# Patient Record
Sex: Male | Born: 1980 | Race: White | Hispanic: No | Marital: Married | Smoking: Former smoker
Health system: Southern US, Community
[De-identification: ages and names within clinical notes are randomized; demographics above are authoritative.]

## PROBLEM LIST (undated history)

## (undated) DIAGNOSIS — G4733 Obstructive sleep apnea (adult) (pediatric): Secondary | ICD-10-CM

## (undated) DIAGNOSIS — G6181 Chronic inflammatory demyelinating polyneuritis: Secondary | ICD-10-CM

## (undated) DIAGNOSIS — Z85820 Personal history of malignant melanoma of skin: Secondary | ICD-10-CM

## (undated) DIAGNOSIS — R739 Hyperglycemia, unspecified: Secondary | ICD-10-CM

## (undated) DIAGNOSIS — E781 Pure hyperglyceridemia: Secondary | ICD-10-CM

## (undated) HISTORY — DX: Pure hyperglyceridemia: E78.1

## (undated) HISTORY — DX: Hyperglycemia, unspecified: R73.9

## (undated) HISTORY — DX: Personal history of malignant melanoma of skin: Z85.820

## (undated) HISTORY — DX: Obstructive sleep apnea (adult) (pediatric): G47.33

## (undated) HISTORY — DX: Chronic inflammatory demyelinating polyneuritis: G61.81

---

## 2000-03-28 ENCOUNTER — Emergency Department (HOSPITAL_COMMUNITY): Admission: EM | Admit: 2000-03-28 | Discharge: 2000-03-28 | Payer: Self-pay | Admitting: Emergency Medicine

## 2003-07-23 ENCOUNTER — Emergency Department (HOSPITAL_COMMUNITY): Admission: EM | Admit: 2003-07-23 | Discharge: 2003-07-23 | Payer: Self-pay | Admitting: Emergency Medicine

## 2008-06-13 ENCOUNTER — Emergency Department (HOSPITAL_BASED_OUTPATIENT_CLINIC_OR_DEPARTMENT_OTHER): Admission: EM | Admit: 2008-06-13 | Discharge: 2008-06-13 | Payer: Self-pay | Admitting: Emergency Medicine

## 2008-06-23 ENCOUNTER — Emergency Department (HOSPITAL_BASED_OUTPATIENT_CLINIC_OR_DEPARTMENT_OTHER): Admission: EM | Admit: 2008-06-23 | Discharge: 2008-06-23 | Payer: Self-pay | Admitting: Emergency Medicine

## 2009-03-29 ENCOUNTER — Emergency Department (HOSPITAL_BASED_OUTPATIENT_CLINIC_OR_DEPARTMENT_OTHER): Admission: EM | Admit: 2009-03-29 | Discharge: 2009-03-30 | Payer: Self-pay | Admitting: Emergency Medicine

## 2009-03-30 ENCOUNTER — Ambulatory Visit: Payer: Self-pay | Admitting: Radiology

## 2015-12-21 HISTORY — PX: OLECRANON BURSECTOMY: SHX2097

## 2016-04-22 ENCOUNTER — Encounter: Payer: Self-pay | Admitting: Family Medicine

## 2016-04-22 ENCOUNTER — Ambulatory Visit (INDEPENDENT_AMBULATORY_CARE_PROVIDER_SITE_OTHER): Payer: 59 | Admitting: Family Medicine

## 2016-04-22 ENCOUNTER — Ambulatory Visit: Payer: Self-pay | Admitting: Family Medicine

## 2016-04-22 VITALS — BP 139/89 | HR 96 | Ht 72.0 in | Wt 282.0 lb

## 2016-04-22 DIAGNOSIS — E669 Obesity, unspecified: Secondary | ICD-10-CM | POA: Diagnosis not present

## 2016-04-22 DIAGNOSIS — Z0000 Encounter for general adult medical examination without abnormal findings: Secondary | ICD-10-CM | POA: Diagnosis not present

## 2016-04-22 DIAGNOSIS — Z85820 Personal history of malignant melanoma of skin: Secondary | ICD-10-CM | POA: Diagnosis not present

## 2016-04-22 DIAGNOSIS — IMO0001 Reserved for inherently not codable concepts without codable children: Secondary | ICD-10-CM | POA: Insufficient documentation

## 2016-04-22 HISTORY — DX: Personal history of malignant melanoma of skin: Z85.820

## 2016-04-22 NOTE — Assessment & Plan Note (Signed)
Doing reasonably well. Obesity is the largest issue here. Refer to medical nutrition therapy. Check fasting labs. Recheck in a few months. Work on smoking cessation.

## 2016-04-22 NOTE — Progress Notes (Signed)
       Terrance Carlson is a 35 y.o. male who [facility] today for status care and wellness visit. Patient presents to clinic today to establish care for wellness visit. He notes persistent obesity as a main medical problem. Additionally he has a history of bilateral knee pain that is currently doing pretty well. 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  Obesity: Continues to be an issue. He's tried some lifestyle modification but finds it challenging. He is very motivated to change his diet.   History reviewed. No pertinent past medical history. History reviewed. No pertinent past surgical history. Social History  Substance Use Topics  . Smoking status: Current Every Day Smoker  . Smokeless tobacco: Not on file  . Alcohol Use: Not on file   family history is not on file.  ROS as above Medications: No current outpatient prescriptions on file.   No current facility-administered medications for this visit.   No Known Allergies   Exam:  BP 139/89 mmHg  Pulse 96  Ht 6' (1.829 m)  Wt 282 lb (127.914 kg)  BMI 38.24 kg/m2 Gen: Well NAD Obese HEENT: EOMI,  MMM Lungs: Normal work of breathing. CTABL Heart: RRR no MRG Abd: NABS, Soft. Nondistended, Nontender Exts: Brisk capillary refill, warm and well perfused.  Skin: 2 small nevus right abdomen without significant irregular borders or multiple pigmentations. Well-appearing scar left abdomen  STOP BANG: Snore:    Yes Tired:     No Observed stop breathing:  No Hypertension:   No  BMI >35:   Yes Age >50:   No Neck > 16 inches:  Yes Male gender:   Yes ------------------------------------------ Total:     4/8   No results found for this or any previous visit (from the past 24 hour(s)). No results found.   Please see individual  assessment and plan sections.

## 2016-04-22 NOTE — Patient Instructions (Signed)
Thank you for coming in today. Get fasting labs soon.  Work on a lower car diet.  Eat black beans, green veggies, backed chicken or fish etc.  Follow up with a Dietitian.  Return in a few months.  Restart diet pills.   Phentermine sustained-release capsules What is this medicine? PHENTERMINE (FEN ter meen) decreases your appetite. It is used with a reduced calorie diet and exercise to help you lose weight. This medicine may be used for other purposes; ask your health care provider or pharmacist if you have questions. What should I tell my health care provider before I take this medicine? They need to know if you have any of these conditions: -agitation -glaucoma -heart disease -high blood pressure -history of substance abuse -lung disease called Primary Pulmonary Hypertension (PPH) -taken an MAOI like Carbex, Eldepryl, Marplan, Nardil, or Parnate in last 14 days -thyroid disease -an unusual or allergic reaction to phentermine, other medicines, foods, dyes, or preservatives -pregnant or trying to get pregnant -breast-feeding How should I use this medicine? Take this medicine by mouth with a glass of water. Follow the directions on the prescription label. This medicine is usually taken before breakfast or at least 10 to 14 hours before going to bed. Avoid taking this medicine in the evening. It may interfere with sleep. Swallow whole. Do not open or chew the capsules. Take your doses at regular intervals. Do not take your medicine more often than directed. Talk to your pediatrician regarding the use of this medicine in children. Special care may be needed. Overdosage: If you think you have taken too much of this medicine contact a poison control center or emergency room at once. NOTE: This medicine is only for you. Do not share this medicine with others. What if I miss a dose? If you miss a dose, take it as soon as you can. If it is almost time for your next dose, take only that dose. Do  not take double or extra doses. What may interact with this medicine? Do not take this medicine with any of the following medications: -duloxetine -MAOIs like Carbex, Eldepryl, Marplan, Nardil, and Parnate -medicines for colds or breathing difficulties like pseudoephedrine or phenylephrine -procarbazine -sibutramine -SSRIs like citalopram, escitalopram, fluoxetine, fluvoxamine, paroxetine, and sertraline -stimulants like dexmethylphenidate, methylphenidate or modafinil -venlafaxine This medicine may also interact with the following medications: -medicines for diabetes This list may not describe all possible interactions. Give your health care provider a list of all the medicines, herbs, non-prescription drugs, or dietary supplements you use. Also tell them if you smoke, drink alcohol, or use illegal drugs. Some items may interact with your medicine. What should I watch for while using this medicine? Notify your physician immediately if you become short of breath while doing your normal activities. Do not take this medicine within 6 hours of bedtime. It can keep you from getting to sleep. Avoid drinks that contain caffeine and try to stick to a regular bedtime every night. This medicine was intended to be used in addition to a healthy diet and exercise. The best results are achieved this way. This medicine is only indicated for short-term use. Eventually your weight loss may level out. At that point, the drug will only help you maintain your new weight. Do not increase or in any way change your dose without consulting your doctor. You may get drowsy or dizzy. Do not drive, use machinery, or do anything that needs mental alertness until you know how this medicine affects you.  Do not stand or sit up quickly, especially if you are an older patient. This reduces the risk of dizzy or fainting spells. Alcohol may increase dizziness and drowsiness. Avoid alcoholic drinks. What side effects may I notice  from receiving this medicine? Side effects that you should report to your doctor or health care professional as soon as possible: -chest pain, palpitations -depression or severe changes in mood -increased blood pressure -irritability -nervousness or restlessness -severe dizziness -shortness of breath -problems urinating -unusual swelling of the legs -vomiting Side effects that usually do not require medical attention (report to your doctor or health care professional if they continue or are bothersome): -blurred vision or other eye problems -changes in sexual ability or desire -constipation or diarrhea -difficulty sleeping -dry mouth or unpleasant taste -headache -nausea This list may not describe all possible side effects. Call your doctor for medical advice about side effects. You may report side effects to FDA at 1-800-FDA-1088. Where should I keep my medicine? Keep out of the reach of children. This medicine can be abused. Keep your medicine in a safe place to protect it from theft. Do not share this medicine with anyone. Selling or giving away this medicine is dangerous and against the law. This medicine may cause accidental overdose and death if taken by other adults, children, or pets. Mix any unused medicine with a substance like cat litter or coffee grounds. Then throw the medicine away in a sealed container like a sealed bag or a coffee can with a lid. Do not use the medicine after the expiration date. Store at room temperature between 20 and 25 degrees C (68 and 77 degrees F). Keep container tightly closed. NOTE: This sheet is a summary. It may not cover all possible information. If you have questions about this medicine, talk to your doctor, pharmacist, or health care provider.    2016, [employer] Standard. (2014-08-27 16:19:17)   Liraglutide injection (Weight Management) What is this medicine? LIRAGLUTIDE (LIR a GLOO tide) is used with a reduced calorie diet and  exercise to help you lose weight. This medicine may be used for other purposes; ask your health care provider or pharmacist if you have questions. What should I tell my health care provider before I take this medicine? They need to know if you have any of these conditions: -endocrine tumors (MEN 2) or if someone in your family had these tumors -gallstones -high cholesterol -history of alcohol abuse problem -history of pancreatitis -kidney disease or if you are on dialysis -liver disease -previous swelling of the tongue, face, or lips with difficulty breathing, difficulty swallowing, hoarseness, or tightening of the throat -stomach problems -suicidal thoughts, plans, or attempt; a previous suicide attempt by you or a family member -thyroid cancer or if someone in your family had thyroid cancer -an unusual or allergic reaction to liraglutide, medicines, foods, dyes, or preservatives -pregnant or trying to get pregnant -breast-feeding How should I use this medicine? This medicine is for injection under the skin of your upper leg, stomach area, or upper arm. You will be taught how to prepare and give this medicine. Use exactly as directed. Take your medicine at regular intervals. Do not take it more often than directed. It is important that you put your used needles and syringes in a special sharps container. Do not put them in a trash can. If you do not have a sharps container, call your pharmacist or healthcare provider to get one. A special MedGuide will be given to you  by the pharmacist with each prescription and refill. Be sure to read this information carefully each time. Talk to your pediatrician regarding the use of this medicine in children. Special care may be needed. Overdosage: If you think you have taken too much of this medicine contact a poison control center or emergency room at once. NOTE: This medicine is only for you. Do not share this medicine with others. What if I miss a  dose? If you miss a dose, take it as soon as you can. If it is almost time for your next dose, take only that dose. Do not take double or extra doses. If you miss your dose for 3 days or more, call your doctor or health care professional to talk about how to restart this medicine. What may interact with this medicine? -acetaminophen -atorvastatin -birth control pills -digoxin -griseofulvin -lisinopril This list may not describe all possible interactions. Give your health care provider a list of all the medicines, herbs, non-prescription drugs, or dietary supplements you use. Also tell them if you smoke, drink alcohol, or use illegal drugs. Some items may interact with your medicine. What should I watch for while using this medicine? Visit your doctor or health care professional for regular checks on your progress. This medicine is intended to be used in addition to a healthy diet and appropriate exercise. The best results are achieved this way. Do not increase or in any way change your dose without consulting your doctor or health care professional. This medicine may affect blood sugar levels. If you have diabetes, check with your doctor or health care professional before you change your diet or the dose of your diabetic medicine. Patients and their families should watch out for worsening depression or thoughts of suicide. Also watch out for sudden changes in feelings such as feeling anxious, agitated, panicky, irritable, hostile, aggressive, impulsive, severely restless, overly excited and hyperactive, or not being able to sleep. If this happens, especially at the beginning of treatment or after a change in dose, call your health care professional. What side effects may I notice from receiving this medicine? Side effects that you should report to your doctor or health care professional as soon as possible: -allergic reactions like skin rash, itching or hives, swelling of the face, lips, or  tongue -breathing problems -fever, chills -loss of appetite -signs and symptoms of low blood sugar such as feeling anxious, confusion, dizziness, increased hunger, unusually weak or tired, sweating, shakiness, cold, irritable, headache, blurred vision, fast heartbeat, loss of consciousness -trouble passing urine or change in the amount of urine -unusual stomach pain or upset -vomiting Side effects that usually do not require medical attention (Report these to your doctor or health care professional if they continue or are bothersome.): -constipation -diarrhea -fatigue -headache -nausea This list may not describe all possible side effects. Call your doctor for medical advice about side effects. You may report side effects to FDA at 1-800-FDA-1088. Where should I keep my medicine? Keep out of the reach of children. Store unopened pen in a refrigerator between 2 and 8 degrees C (36 and 46 degrees F). Do not freeze or use if the medicine has been frozen. Protect from light and excessive heat. After you first use the pen, it can be stored at room temperature between 15 and 30 degrees C (59 and 86 degrees F) or in a refrigerator. Throw away your used pen after 30 days or after the expiration date, whichever comes first. Do not store your  pen with the needle attached. If the needle is left on, medicine may leak from the pen. NOTE: This sheet is a summary. It may not cover all possible information. If you have questions about this medicine, talk to your doctor, pharmacist, or health care provider.    2016, [employer] Standard. (2014-01-31 12:29:49)   Bupropion; Naltrexone extended-release tablets What is this medicine? BUPROPION; NALTREXONE (byoo PROE pee on; nal TREX one) is a combination product used to promote and maintain weight loss in obese adults or overweight adults who also have weight related medical problems. This medicine should be used with a reduced calorie diet and increased  physical activity. This medicine may be used for other purposes; ask your health care provider or pharmacist if you have questions. What should I tell my health care provider before I take this medicine? They need to know if you have any of these conditions: -an eating disorder, such as anorexia or bulimia -diabetes -glaucoma -head injury -heart disease -high blood pressure -history of a drug or alcohol abuse problem -history of a tumor or infection of your brain or spine -history of stroke -history of irregular heartbeat -kidney disease -liver disease -mental illness such as bipolar disorder or psychosis -seizures -suicidal thoughts, plans, or attempt; a previous suicide attempt by you or a family member -an unusual or allergic reaction to bupropion, naltrexone, other medicines, foods, dyes, or preservatives breast-feeding -pregnant or trying to become pregnant How should I use this medicine? Take this medicine by mouth with a glass of water. Follow the directions on the prescription label. Take this medicine in the morning and in the evenings as directed by your healthcare professional. Bonita QuinYou can take it with or without food. Do not take with high-fat meals as this may increase your risk of seizures. Do not crush, chew, or cut these tablets. Do not take your medicine more often than directed. Do not stop taking this medicine suddenly except upon the advice of your doctor. A special MedGuide will be given to you by the pharmacist with each prescription and refill. Be sure to read this information carefully each time. Talk to your pediatrician regarding the use of this medicine in children. Special care may be needed. Overdosage: If you think you have taken too much of this medicine contact a poison control center or emergency room at once. NOTE: This medicine is only for you. Do not share this medicine with others. What if I miss a dose? If you miss a dose, skip the missed dose and take  your next tablet at the regular time. Do not take double or extra doses. What may interact with this medicine? Do not take this medicine with any of the following medications: -any prescription or street opioid drug like codiene, heroin, methadone -linezolid -MAOIs like Carbex, Eldepryl, Marplan, Nardil, and Parnate -methylene blue (injected into a vein) -other medicines that contain bupropion like Zyban or Wellbutrin This medicine may also interact with the following medications: -alcohol -certain medicines for anxiety or sleep -certain medicines for blood pressure like metoprolol, propranolol -certain medicines for depression or psychotic disturbances -certain medicines for HIV or AIDS like efavirenz, lopinavir, nelfinavir, ritonavir -certain medicines for irregular heart beat like propafenone, flecainide -certain medicines for Parkinson's disease like amantadine, levodopa -certain medicines for seizures like carbamazepine, phenytoin, phenobarbital -cimetidine -clopidogrel -cyclophosphamide -disulfiram -furazolidone -isoniazid -nicotine -orphenadrine -procarbazine -steroid medicines like prednisone or cortisone -stimulant medicines for attention disorders, weight loss, or to stay awake -tamoxifen -theophylline -thioridazine -thiotepa -ticlopidine -tramadol -warfarin  This list may not describe all possible interactions. Give your health care provider a list of all the medicines, herbs, non-prescription drugs, or dietary supplements you use. Also tell them if you smoke, drink alcohol, or use illegal drugs. Some items may interact with your medicine. What should I watch for while using this medicine? This medicine is intended to be used in addition to a healthy diet and appropriate exercise. The best results are achieved this way. Do not increase or in any way change your dose without consulting your doctor or health care professional. Do not take this medicine with other  prescription or over-the-counter weight loss products without consulting your doctor or health care professional. Your doctor should tell you to stop taking this medicine if you do not lose a certain amount of weight within the first 12 weeks of treatment. Visit your doctor or health care professional for regular checkups. Your doctor may order blood tests or other tests to see how you are doing. This medicine may affect blood sugar levels. If you have diabetes, check with your doctor or health care professional before you change your diet or the dose of your diabetic medicine. Patients and their families should watch out for new or worsening depression or thoughts of suicide. Also watch out for sudden changes in feelings such as feeling anxious, agitated, panicky, irritable, hostile, aggressive, impulsive, severely restless, overly excited and hyperactive, or not being able to sleep. If this happens, especially at the beginning of treatment or after a change in dose, call your health care professional. Avoid alcoholic drinks while taking this medicine. Drinking large amounts of alcoholic beverages, using sleeping or anxiety medicines, or quickly stopping the use of these agents while taking this medicine may increase your risk for a seizure. What side effects may I notice from receiving this medicine? Side effects that you should report to your doctor or health care professional as soon as possible: -allergic reactions like skin rash, itching or hives, swelling of the face, lips, or tongue -breathing problems -changes in vision, hearing -chest pain -confusion -dark urine -depressed mood -fast or irregular heart beat -fever -hallucination, loss of contact with reality -increased blood pressure -light-colored stools -redness, blistering, peeling or loosening of the skin, including inside the mouth -right upper belly pain -seizures -suicidal thoughts or other mood changes -unusually weak or  tired -vomiting -yellowing of the eyes or skin Side effects that usually do not require medical attention (Report these to your doctor or health care professional if they continue or are bothersome.): -constipation -diarrhea -dizziness -dry mouth -headache -nausea -trouble sleeping This list may not describe all possible side effects. Call your doctor for medical advice about side effects. You may report side effects to FDA at 1-800-FDA-1088. Where should I keep my medicine? Keep out of the reach of children. Store at room temperature between 15 and 30 degrees C (59 and 86 degrees F). Throw away any unused medicine after the expiration date. NOTE: This sheet is a summary. It may not cover all possible information. If you have questions about this medicine, talk to your doctor, pharmacist, or health care provider.    2016, [employer] Standard. (2013-09-12 15:17:29)   Lorcaserin oral tablets What is this medicine? LORCASERIN (lor ca SER in) is used to promote and maintain weight loss in obese patients. This medicine should be used with a reduced calorie diet and, if appropriate, an exercise program. This medicine may be used for other purposes; ask your health care provider or  pharmacist if you have questions. What should I tell my health care provider before I take this medicine? They need to know if you have any of these conditions: -anatomical deformation of the penis, Peyronie's disease, or history of priapism (painful and prolonged erection) -diabetes -heart disease -history of blood diseases, like sickle cell anemia or leukemia -history of irregular heartbeat -kidney disease -liver disease -suicidal thoughts, plans, or attempt; a previous suicide attempt by you or a family member -an unusual or allergic reaction to lorcaserin, other medicines, foods, dyes, or preservatives -pregnant or trying to get pregnant -breast-feeding How should I use this medicine? Take this  medicine by mouth with a glass of water. Follow the directions on the prescription label. You can take it with or without food. Take your medicine at regular intervals. Do not take it more often than directed. Do not stop taking except on your doctor's advice. Talk to your pediatrician regarding the use of this medicine in children. Special care may be needed. Overdosage: If you think you have taken too much of this medicine contact a poison control center or emergency room at once. NOTE: This medicine is only for you. Do not share this medicine with others. What if I miss a dose? If you miss a dose, take it as soon as you can. If it is almost time for your next dose, take only that dose. Do not take double or extra doses. What may interact with this medicine? -cabergoline -certain medicines for depression, anxiety, or psychotic disturbances -certain medicines for erectile dysfunction -certain medicines for migraine headache like almotriptan, eletriptan, frovatriptan, naratriptan, rizatriptan, sumatriptan, zolmitriptan -dextromethorphan -linezolid -lithium -medicines for diabetes -other weight loss products -tramadol -St. John's Wort -stimulant medicines for attention disorders, weight loss, or to stay awake -tryptophan This list may not describe all possible interactions. Give your health care provider a list of all the medicines, herbs, non-prescription drugs, or dietary supplements you use. Also tell them if you smoke, drink alcohol, or use illegal drugs. Some items may interact with your medicine. What should I watch for while using this medicine? This medicine is intended to be used in addition to a healthy diet and appropriate exercise. The best results are achieved this way. Your doctor should instruct you to stop taking this medicine if you do not lose a certain amount of weight within the first 12 weeks of treatment, but it is important that you do not change your dose in any way  without consulting your doctor or health care professional. Visit your doctor or health care professional for regular checkups. Your doctor may order blood tests or other tests to see how you are doing. Do not drive, use machinery, or do anything that needs mental alertness until you know how this medicine affects you. This medicine may affect blood sugar levels. If you have diabetes, check with your doctor or health care professional before you change your diet or the dose of your diabetic medicine. Patients and their families should watch out for worsening depression or thoughts of suicide. Also watch out for sudden changes in feelings such as feeling anxious, agitated, panicky, irritable, hostile, aggressive, impulsive, severely restless, overly excited and hyperactive, or not being able to sleep. If this happens, especially at the beginning of treatment or after a change in dose, call your health care professional. Contact your doctor or health care professional right away if you are a man with an erection that lasts longer than 4 hours or if the erection  becomes painful. This may be a sign of serious problem and must be treated right away to prevent permanent damage. What side effects may I notice from receiving this medicine? Side effects that you should report to your doctor or health care professional as soon as possible: -allergic reactions like skin rash, itching or hives, swelling of the face, lips, or tongue -abnormal production of milk -breast enlargement in both males and females -breathing problems -changes in emotions or moods -changes in vision -confusion -erection lasting more than 4 hours or a painful erection -fast or irregular heart beat -feeling faint or lightheaded, falls -fever or chills, sore throat -hallucination, loss of contact with reality -high or low blood pressure -menstrual changes -restlessness -slow or irregular heartbeat -stiff muscles -sweating -suicidal  thoughts or other mood changes -swelling of the ankles, feet, hands -unusually weak or tired -vomiting Side effects that usually do not require medical attention (Report these to your doctor or health care professional if they continue or are bothersome.): -back pain -constipation -cough -dry mouth -nausea -tiredness This list may not describe all possible side effects. Call your doctor for medical advice about side effects. You may report side effects to FDA at 1-800-FDA-1088. Where should I keep my medicine? Keep out of the reach of children. This medicine can be abused. Keep your medicine in a safe place to protect it from theft. Do not share this medicine with anyone. Selling or giving away this medicine is dangerous and against the law. Store at room temperature between 15 and 30 degrees C (59 and 86 degrees F). Throw away any unused medicine after the expiration date. NOTE: This sheet is a summary. It may not cover all possible information. If you have questions about this medicine, talk to your doctor, pharmacist, or health care provider.    2016, Elsevier/Gold Standard. (2015-07-14 16:21:05)

## 2016-04-27 LAB — CBC
HCT: 44.9 % (ref 38.5–50.0)
HEMOGLOBIN: 15.6 g/dL (ref 13.2–17.1)
MCH: 30.4 pg (ref 27.0–33.0)
MCHC: 34.7 g/dL (ref 32.0–36.0)
MCV: 87.4 fL (ref 80.0–100.0)
MPV: 10.6 fL (ref 7.5–12.5)
Platelets: 265 10*3/uL (ref 140–400)
RBC: 5.14 MIL/uL (ref 4.20–5.80)
RDW: 14 % (ref 11.0–15.0)
WBC: 7.7 10*3/uL (ref 3.8–10.8)

## 2016-04-27 LAB — HEMOGLOBIN A1C
HEMOGLOBIN A1C: 5.5 % (ref <5.7)
MEAN PLASMA GLUCOSE: 111 mg/dL

## 2016-04-28 LAB — COMPREHENSIVE METABOLIC PANEL
ALBUMIN: 4.6 g/dL (ref 3.6–5.1)
ALT: 23 U/L (ref 9–46)
AST: 17 U/L (ref 10–40)
Alkaline Phosphatase: 60 U/L (ref 40–115)
BUN: 17 mg/dL (ref 7–25)
CHLORIDE: 104 mmol/L (ref 98–110)
CO2: 25 mmol/L (ref 20–31)
CREATININE: 0.88 mg/dL (ref 0.60–1.35)
Calcium: 9.4 mg/dL (ref 8.6–10.3)
Glucose, Bld: 101 mg/dL — ABNORMAL HIGH (ref 65–99)
Potassium: 4.9 mmol/L (ref 3.5–5.3)
SODIUM: 138 mmol/L (ref 135–146)
TOTAL PROTEIN: 6.8 g/dL (ref 6.1–8.1)
Total Bilirubin: 0.6 mg/dL (ref 0.2–1.2)

## 2016-04-28 LAB — LIPID PANEL
Cholesterol: 190 mg/dL (ref 125–200)
HDL: 41 mg/dL (ref >=40)
LDL Cholesterol: 77 mg/dL (ref <130)
Total CHOL/HDL Ratio: 4.6 Ratio (ref <=5.0)
Triglycerides: 361 mg/dL — ABNORMAL HIGH (ref <150)
VLDL: 72 mg/dL — ABNORMAL HIGH (ref <30)

## 2016-04-28 LAB — TSH: TSH: 1.34 m[IU]/L (ref 0.40–4.50)

## 2016-04-28 LAB — VITAMIN D 25 HYDROXY (VIT D DEFICIENCY, FRACTURES): VIT D 25 HYDROXY: 21 ng/mL — AB (ref 30–100)

## 2016-04-29 ENCOUNTER — Encounter: Payer: Self-pay | Admitting: Family Medicine

## 2016-04-29 DIAGNOSIS — E781 Pure hyperglyceridemia: Secondary | ICD-10-CM

## 2016-04-29 DIAGNOSIS — R739 Hyperglycemia, unspecified: Secondary | ICD-10-CM

## 2016-04-29 DIAGNOSIS — E559 Vitamin D deficiency, unspecified: Secondary | ICD-10-CM | POA: Insufficient documentation

## 2016-04-29 HISTORY — DX: Pure hyperglyceridemia: E78.1

## 2016-04-29 HISTORY — DX: Hyperglycemia, unspecified: R73.9

## 2016-04-29 NOTE — Progress Notes (Signed)
Quick Note:  Vitamin D deficiency noted. Take 2000 units of vitamin D daily over-the-counter.  Triglycerides are really high. Take 1gram of over the counter Omega3 twice daily.  Labs look ok otherwise.  Return in 3 month for recheck. ______

## 2016-05-19 ENCOUNTER — Telehealth: Payer: Self-pay | Admitting: Family Medicine

## 2016-05-19 DIAGNOSIS — R635 Abnormal weight gain: Secondary | ICD-10-CM

## 2016-05-19 DIAGNOSIS — E782 Mixed hyperlipidemia: Secondary | ICD-10-CM

## 2016-05-19 NOTE — Telephone Encounter (Signed)
Will clarify order for Medical Nutrition.

## 2016-07-29 ENCOUNTER — Ambulatory Visit (INDEPENDENT_AMBULATORY_CARE_PROVIDER_SITE_OTHER): Payer: 59 | Admitting: Family Medicine

## 2016-07-29 ENCOUNTER — Encounter: Payer: Self-pay | Admitting: Family Medicine

## 2016-07-29 VITALS — BP 137/87 | HR 76 | Ht 72.0 in | Wt 272.0 lb

## 2016-07-29 DIAGNOSIS — K5900 Constipation, unspecified: Secondary | ICD-10-CM

## 2016-07-29 DIAGNOSIS — E669 Obesity, unspecified: Secondary | ICD-10-CM

## 2016-07-29 DIAGNOSIS — E559 Vitamin D deficiency, unspecified: Secondary | ICD-10-CM

## 2016-07-29 DIAGNOSIS — E781 Pure hyperglyceridemia: Secondary | ICD-10-CM

## 2016-07-29 NOTE — Patient Instructions (Signed)
Thank you for coming in today. Continue your medicines.  Return in 1 year if all is well.  Get fasting labs soon.

## 2016-07-29 NOTE — Progress Notes (Signed)
       Tommye StandardKelly Gopaul is a 35 y.o. male who [facility] today for follow up of:  1. Obesity: Lost 10 pounds since his last visit with diet and exercise modification.  Has noticed a decreased stool frequency with more firm stools since starting his lifestyle modifications.  Has tried Miralax without much improvement.  No nausea, vomiting, or abdominal pain.    2.  Hypertriglyceridemia:  No complaints on Omega 3s.  3.  Tobacco abuse:  Decreased from 1 pack per day to 1/2 pack per day.  Doesn't want to fully quit at this time so he can get his weight under better control.     No past medical history on file. No past surgical history on file. Social History  Substance Use Topics  . Smoking status: Current Every Day Smoker  . Smokeless tobacco: Not on file  . Alcohol use Not on file   family history is not on file.  ROS as above:   Medications: No current outpatient prescriptions on file.   No current facility-administered medications for this visit.    No Known Allergies   Exam:  BP 137/87   Pulse 76   Ht 6' (1.829 m)   Wt 272 lb (123.4 kg)   BMI 36.89 kg/m  Gen: Well NAD Lungs: Normal work of breathing. CTABL Heart: RRR no MRG Abd: NABS, Soft. Nondistended, Nontender Exts: Brisk capillary refill, warm and well perfused.   No results found for this or any previous visit (from the past 24 hour(s)). No results found.    Assessment and Plan: 35 y.o. male with:  1.  Constipation:  Likely from eating less than before.  Encouraged an increase in fiber consumption  2.  Hyperlipidemia: Check fasting lipid panel  3.  Tobacco abuse: Not interested in quitting at this time.  Will reassess at next visit  4.  Vitamin D Deficiency:  Continue taking 2000 units daily. Recheck Vitamin D level     Orders Placed This Encounter  Procedures  . Lipid  panel  . VITAMIN D 25 Hydroxy (Vit-D Deficiency, Fractures)    Discussed warning signs or symptoms. Please see discharge instructions. Patient expresses understanding.

## 2016-08-04 LAB — LIPID PANEL
CHOL/HDL RATIO: 4.5 ratio (ref <=5.0)
Cholesterol: 207 mg/dL — ABNORMAL HIGH (ref 125–200)
HDL: 46 mg/dL (ref >=40)
Triglycerides: 420 mg/dL — ABNORMAL HIGH (ref <150)

## 2016-08-04 LAB — VITAMIN D 25 HYDROXY (VIT D DEFICIENCY, FRACTURES): VIT D 25 HYDROXY: 25 ng/mL — AB (ref 30–100)

## 2016-11-09 ENCOUNTER — Encounter: Payer: Self-pay | Admitting: Family Medicine

## 2016-11-09 ENCOUNTER — Ambulatory Visit (INDEPENDENT_AMBULATORY_CARE_PROVIDER_SITE_OTHER): Payer: 59 | Admitting: Family Medicine

## 2016-11-09 DIAGNOSIS — M7021 Olecranon bursitis, right elbow: Secondary | ICD-10-CM

## 2016-11-09 NOTE — Progress Notes (Signed)
   Tommye StandardKelly Pavlicek is a 35 y.o. male who [facility] today for right elbow swelling. Patient notes a few day history of painless swelling of his right elbow. He denies any fevers or chills nausea vomiting diarrhea or recent injury. Several months ago he suffered a laceration to the same area that was repaired with sutures but did not have any pain or swelling until just recently. He has not tried any treatment yet. He feels well otherwise.   No past medical history on file. No past surgical history on file. Social History  Substance Use Topics  . Smoking status: Current Every Day Smoker  . Smokeless tobacco: Not on file  . Alcohol use Not on file     ROS:  As above   Medications: No current outpatient prescriptions on file.   No current facility-administered medications for this visit.    No Known Allergies   Exam:  BP 129/90   Pulse 79   Wt 278 lb (126.1 kg)   BMI 37.70 kg/m  General: Well Developed, well nourished, and in no acute distress.  Neuro/Psych: Alert and oriented x3, extra-ocular muscles intact, able to move all 4 extremities, sensation grossly intact. Skin: Warm and dry, no rashes noted.  Respiratory: Not using accessory muscles, speaking in full sentences, trachea midline.  Cardiovascular: Pulses palpable, no extremity edema. Abdomen: Does not appear distended. MSK: Right elbow diffusely swollen olecranon and forearm area. Nontender with no erythema or induration. No expressible pus.  Procedure: Real-time Ultrasound Guided aspiration and Injection of right olecranon bursitis  Device: GE Logiq E  Images permanently stored and available for review in the ultrasound unit. Verbal informed consent obtained. Discussed risks and benefits of procedure. Warned about infection bleeding damage to structures skin hypopigmentation and fat atrophy among others. Patient expresses understanding and agreement Time-out  conducted.  Noted no overlying erythema, induration, or other signs of local infection.  Skin prepped in a sterile fashion.  Local anesthesia: Topical Ethyl chloride.  2 mL of lidocaine injected superficially to achieve a good area of anesthesia With sterile technique and under real time ultrasound guidance: 18-gauge needle inserted into the bursa and 15 mL of yellowish minimally cloudy fluid aspirated. Syringe exchanged and 40 mg of Kenalog and 2 mL of Marcaine injected easily.  Completed without difficulty   Advised to call if fevers/chills, erythema, induration, drainage, or persistent bleeding.  Images permanently stored and available for review in the ultrasound unit.  Impression: Technically successful ultrasound guided injection.      No results found for this or any previous visit (from the past 48 hour(s)). No results found.    Assessment and Plan: 35 y.o. male with olecranon bursitis. Fluid culture and cell count differential and crystal analysis pending. Likely traumatic. Doubtful for infectious. Plan for compression and recheck in one month.    Orders Placed This Encounter  Procedures  . Body Fluid Culture    Right olecranon bursitis  . Synovial cell count + diff, w/ crystals    Right olecranon bursitis    Discussed warning signs or symptoms. Please see discharge instructions. Patient expresses understanding.

## 2016-11-11 LAB — SYNOVIAL CELL COUNT + DIFF, W/ CRYSTALS
BASOPHILS, %: 0 %
Eosinophils-Synovial: 0 % (ref 0–2)
LYMPHOCYTES-SYNOVIAL FLD: 72 % (ref 0–74)
Monocyte/Macrophage: 21 % (ref 0–69)
Neutrophil, Synovial: 5 % (ref 0–24)
Synoviocytes, %: 2 % (ref 0–15)
WBC, Synovial: 500 cells/uL — ABNORMAL HIGH (ref <150)

## 2016-11-14 LAB — BODY FLUID CULTURE
GRAM STAIN: NONE SEEN
ORGANISM ID, BACTERIA: NO GROWTH

## 2016-12-08 ENCOUNTER — Encounter: Payer: Self-pay | Admitting: Family Medicine

## 2016-12-08 ENCOUNTER — Ambulatory Visit (INDEPENDENT_AMBULATORY_CARE_PROVIDER_SITE_OTHER): Payer: 59 | Admitting: Family Medicine

## 2016-12-08 VITALS — BP 143/87 | HR 88 | Temp 98.2°F | Wt 284.0 lb

## 2016-12-08 DIAGNOSIS — R2231 Localized swelling, mass and lump, right upper limb: Secondary | ICD-10-CM | POA: Diagnosis not present

## 2016-12-08 DIAGNOSIS — L539 Erythematous condition, unspecified: Secondary | ICD-10-CM | POA: Diagnosis not present

## 2016-12-08 DIAGNOSIS — M7021 Olecranon bursitis, right elbow: Secondary | ICD-10-CM

## 2016-12-08 MED ORDER — CEFUROXIME AXETIL 500 MG PO TABS: 500.0000 mg | ORAL_TABLET | Freq: Two times a day (BID) | ORAL | 0 refills | Status: DC

## 2016-12-08 MED ORDER — DOXYCYCLINE HYCLATE 100 MG PO TABS: 100.0000 mg | ORAL_TABLET | Freq: Two times a day (BID) | ORAL | 0 refills | Status: DC

## 2016-12-08 NOTE — Progress Notes (Signed)
Terrance Carlson is a 35 y.o. male who presents to [facility] today for right elbow swelling. Patient was seen about a month ago for swelling at the right elbow. He was diagnosed with olecranon bursitis and had aspiration and injection. The fluid culture was negative as well as cell count and crystal analysis. He did well with intermittent compression until yesterday when the swelling returned and got worse. This is associated with forearm pain and some redness. He denies any fevers or chills nausea or vomiting.   No past medical history on file. No past surgical history on file. Social History  Substance Use Topics  . Smoking status: Current Every Day Smoker  . Smokeless tobacco: Not on file  . Alcohol use Not on file     ROS:  As above   Medications: Current Outpatient Prescriptions  Medication Sig Dispense Refill  . cefUROXime (CEFTIN) 500 MG tablet Take 1 tablet (500 mg total) by mouth 2 (two) times daily with a meal. 14 tablet 0  . doxycycline (VIBRA-TABS) 100 MG tablet Take 1 tablet (100 mg total) by mouth 2 (two) times daily. 14 tablet 0   No current facility-administered medications for this visit.    No Known Allergies   Exam:  There were no vitals taken for this visit. General: Well Developed, well nourished, and in no acute distress.  Neuro/Psych: Alert and oriented x3, extra-ocular muscles intact, able to move all 4 extremities, sensation grossly intact. Skin: Warm and dry, no rashes noted.  Respiratory: Not using accessory muscles, speaking in full sentences, trachea midline.  Cardiovascular: Pulses palpable, no extremity edema. Abdomen: Does not appear distended. MSK: Diffuse swelling concentrated at the posterior aspect of the right elbow on the olecranon. He also has some forearm swelling approximately 4-5 cm distal to the elbow associated with mild tenderness and mild erythema.  Pulses capillary refill and sensation  are intact distally. Procedure: Real-time Ultrasound Guided aspiration and Injection of right olecranon bursitis  Device: GE Logiq E  Images permanently stored and available for review in the ultrasound unit. Verbal informed consent obtained. Discussed risks and benefits of procedure. Warned about infection bleeding damage to structures skin hypopigmentation and fat atrophy among others. Patient expresses understanding and agreement Time-out conducted.  Noted no overlying erythema, induration, or other signs of local infection.  Skin prepped in a sterile fashion.  Local anesthesia: Topical Ethyl chloride.  2 mL of lidocaine injected superficially to achieve a good area of anesthesia With sterile technique and under real time ultrasound guidance: 18-gauge needle inserted into the bursa and 25 mL of yellowish minimally cloudy fluid aspirated. Syringe exchanged and 40 mg of Kenalog and 1 mL of Marcaine injected easily.  Completed without difficulty   Advised to call if fevers/chills, erythema, induration, drainage, or persistent bleeding.  Images permanently stored and available for review in the ultrasound unit.  Impression: Technically successful ultrasound guided injection.   No results found for this or any previous visit (from the past 48 hour(s)). No results found.    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 There is also a possible area of cellulitis. Will send fluid for culture and treat empirically with steroid injection and compression. Recommend patient be more diligent with elbow compression.  As for the possible cellulitis we'll treat empirically with doxycycline and Ceftin. Return as needed.    Orders Placed This Encounter  Procedures  . Body Fluid Culture    Right elbow olecranon  bursitis    Discussed warning signs or symptoms. Please see discharge instructions. Patient expresses understanding.

## 2016-12-08 NOTE — Patient Instructions (Signed)
Thank you for coming in today. Call or go to the ER if you develop a large red swollen joint with extreme pain or oozing puss.  Use compression.  Return if worse.    Elbow Bursitis Introduction Elbow bursitis is inflammation of the fluid-filled sac (bursa) between the tip of your elbow bone (olecranon) and your skin. Elbow bursitis may also be called olecranon bursitis. Normally, the olecranon bursa has only a small amount of fluid in it to cushion and protect your elbow bone. Elbow bursitis causes fluid to build up inside the bursa. Over time, this swelling and inflammation can cause pain when you bend or lean on your elbow. What are the causes? Elbow bursitis may be caused by:  Elbow injury (acute trauma).  Leaning on hard surfaces for long periods of time.  Infection from an injury that breaks the skin near your elbow.  A bone growth (spur) that forms at the tip of your elbow.  A medical condition that causes inflammation in your body, such as gout or rheumatoid arthritis. The cause may also be unknown. What are the signs or symptoms? The first sign of elbow bursitis is usually swelling over the tip of your elbow. This can grow to be the size of a golf ball. This may start suddenly or develop gradually. You may also have:  Pain when bending or leaning on your elbow.  Restricted movement of your elbow. If your bursitis is caused by an infection, symptoms may also include:  Redness, warmth, and tenderness of the elbow.  Drainage of pus from the swollen area over your elbow, if the skin breaks open. How is this diagnosed? Your health care provider may be able to diagnose elbow bursitis based on your signs and symptoms, especially if you have recently been injured. Your health care provider will also do a physical exam. This may include:  X-rays to look for a bone spur or a bone fracture.  Draining fluid from the bursa to test it for infection.  Blood tests to rule out gout or  rheumatoid arthritis. How is this treated? Treatment for elbow bursitis depends on the cause. Treatment may include:  Medicines. These may include:  Over-the-counter medicines to relieve pain and inflammation.  Antibiotic medicines to fight infection.  Injections of anti-inflammatory medicines (steroids).  Wrapping your elbow with a bandage.  Draining fluid from the bursa.  Wearing elbow pads. If your bursitis does not get better with treatment, surgery may be needed to remove the bursa. Follow these instructions at home:  Take medicines only as directed by your health care provider.  If you were prescribed an antibiotic medicine, finish all of it even if you start to feel better.  If your bursitis is caused by an injury, rest your elbow and wear your bandage as directed by your health care provider. You may alsoapply ice to the injured area as directed by your health care provider:  Put ice in a plastic bag.  Place a towel between your skin and the bag.  Leave the ice on for 20 minutes, 2-3 times per day.  Avoid any activities that cause elbow pain.  Use elbow pads or elbow wraps to cushion your elbow. Contact a health care provider if:  You have a fever.  Your symptoms do not get better with treatment.  Your pain or swelling gets worse.  Your elbow pain or swelling goes away and then returns.  You have drainage of pus from the swollen area over your  elbow. This information is not intended to replace advice given to you by your health care provider. Make sure you discuss any questions you have with your health care provider. Document Released: 01/05/2007 Document Revised: 05/13/2016 Document Reviewed: 08/14/2014  2017 Elsevier   Cellulitis, Adult Cellulitis is a skin infection. The infected area is usually red and tender. This condition occurs most often in the arms and lower legs. The infection can travel to the muscles, blood, and underlying tissue and become  serious. It is very important to get treated for this condition. What are the causes? Cellulitis is caused by bacteria. The bacteria enter through a break in the skin, such as a cut, burn, insect bite, open sore, or crack. What increases the risk? This condition is more likely to occur in people who:  Have a weak defense system (immune system).  Have open wounds on the skin such as cuts, burns, bites, and scrapes. Bacteria can enter the body through these open wounds.  Are older.  Have diabetes.  Have a type of long-lasting (chronic) liver disease (cirrhosis) or kidney disease.  Use IV drugs. What are the signs or symptoms? Symptoms of this condition include:  Redness, streaking, or spotting on the skin.  Swollen area of the skin.  Tenderness or pain when an area of the skin is touched.  Warm skin.  Fever.  Chills.  Blisters. How is this diagnosed? This condition is diagnosed based on a medical history and physical exam. You may also have tests, including:  Blood tests.  Lab tests.  Imaging tests. How is this treated? Treatment for this condition may include:  Medicines, such as antibiotic medicines or antihistamines.  Supportive care, such as rest and application of cold or warm cloths (cold or warm compresses) to the skin.  Hospital care, if the condition is severe. The infection usually gets better within 1-2 days of treatment. Follow these instructions at home:  Take over-the-counter and prescription medicines only as told by your health care provider.  If you were prescribed an antibiotic medicine, take it as told by your health care provider. Do not stop taking the antibiotic even if you start to feel better.  Drink enough fluid to keep your urine clear or pale yellow.  Do not touch or rub the infected area.  Raise (elevate) the infected area above the level of your heart while you are sitting or lying down.  Apply warm or cold compresses to the  affected area as told by your health care provider.  Keep all follow-up visits as told by your health care provider. This is important. These visits let your health care provider make sure a more serious infection is not developing. Contact a health care provider if:  You have a fever.  Your symptoms do not improve within 1-2 days of starting treatment.  Your bone or joint underneath the infected area becomes painful after the skin has healed.  Your infection returns in the same area or another area.  You notice a swollen bump in the infected area.  You develop new symptoms.  You have a general ill feeling (malaise) with muscle aches and pains. Get help right away if:  Your symptoms get worse.  You feel very sleepy.  You develop vomiting or diarrhea that persists.  You notice red streaks coming from the infected area.  Your red area gets larger or turns dark in color. This information is not intended to replace advice given to you by your health care  provider. Make sure you discuss any questions you have with your health care provider. Document Released: 09/15/2005 Document Revised: 04/15/2016 Document Reviewed: 10/15/2015 Elsevier Interactive Patient Education  2017 [employer]

## 2016-12-09 ENCOUNTER — Ambulatory Visit: Payer: 59 | Admitting: Family Medicine

## 2016-12-12 LAB — BODY FLUID CULTURE
Gram Stain: NONE SEEN
ORGANISM ID, BACTERIA: NO GROWTH

## 2016-12-16 ENCOUNTER — Encounter: Payer: Self-pay | Admitting: Sports Medicine

## 2016-12-16 ENCOUNTER — Ambulatory Visit (INDEPENDENT_AMBULATORY_CARE_PROVIDER_SITE_OTHER): Payer: 59 | Admitting: Sports Medicine

## 2016-12-16 ENCOUNTER — Ambulatory Visit (INDEPENDENT_AMBULATORY_CARE_PROVIDER_SITE_OTHER): Payer: 59

## 2016-12-16 DIAGNOSIS — M7021 Olecranon bursitis, right elbow: Secondary | ICD-10-CM

## 2016-12-16 DIAGNOSIS — M19021 Primary osteoarthritis, right elbow: Secondary | ICD-10-CM | POA: Diagnosis not present

## 2016-12-16 MED ORDER — PREDNISONE 10 MG (48) PO TBPK: ORAL_TABLET | Freq: Every day | ORAL | 0 refills | Status: DC

## 2016-12-16 NOTE — Assessment & Plan Note (Addendum)
Overall improving. Cultures have been negative 2. I'm going to switch him to a different compression sleeve with a little bit tighter compression. Prednisone taper. I think this is overall nearing the tail end, he has 1 more day of antibiotics. He will finish these. I'm going to add some x-rays to just tee him up in case we do need an MRI.  Return to see us in a month.  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

## 2016-12-16 NOTE — Progress Notes (Signed)
  Subjective:    CC: Follow-up  HPI: Terrance Carlson is a pleasant 35 year old male, he has a right olecranon bursitis that has been drained and injected a couple of times, aspirations showed essentially unremarkable cell counts, and cultures negative 2. He was on antibiotic, and reports some improvement in symptoms, he is back here for follow-up. Denies any constitutional symptoms, only has a bit of pain at the olecranon bursa, but some more significant pain further down the forearm.   Past medical history:  Negative.  See flowsheet/record as well for more information.  Surgical history: Negative.  See flowsheet/record as well for more information.  Family history: Negative.  See flowsheet/record as well for more information.  Social history: Negative.  See flowsheet/record as well for more information.  Allergies, and medications have been entered into the medical record, reviewed, and no changes needed.   Review of Systems: No fevers, chills, night sweats, weight loss, chest pain, or shortness of breath.   Objective:    General: Well Developed, well nourished, and in no acute distress.  Neuro: Alert and oriented x3, extra-ocular muscles intact, sensation grossly intact.  HEENT: Normocephalic, atraumatic, pupils equal round reactive to light, neck supple, no masses, no lymphadenopathy, thyroid nonpalpable.  Skin: Warm and dry, no rashes. Cardiac: Regular rate and rhythm, no murmurs rubs or gallops, no lower extremity edema.  Respiratory: Clear to auscultation bilaterally. Not using accessory muscles, speaking in full sentences. Right Elbow: Slight fullness and swelling at the olecranon bursa with no overlying erythema, only minimal warmth and tenderness, does not appear to be infected. He does have a focal area of tenderness over the mid posterior ulnar shaft. Range of motion full pronation, supination, flexion, extension. Strength is full to all of the above directions Stable to varus, valgus  stress. Negative moving valgus stress test. No discrete areas of tenderness to palpation. Ulnar nerve does not sublux. Negative cubital tunnel Tinel's.  Tight elbow compression sleeve applied.  Impression and Recommendations:    Olecranon bursitis, right elbow Overall improving. Cultures have been negative 2. I'm going to switch him to a different compression sleeve with a little bit tighter compression. Prednisone taper. I think this is overall nearing the tail end, he has 1 more day of antibiotics. He will finish these. I'm going to add some x-rays to just tee him up in case we do need an MRI.  Return to see us in a month.

## 2017-01-10 ENCOUNTER — Telehealth: Payer: Self-pay

## 2017-01-10 MED ORDER — TRAMADOL HCL 50 MG PO TABS: 50.0000 mg | ORAL_TABLET | Freq: Three times a day (TID) | ORAL | 0 refills | Status: DC | PRN

## 2017-01-10 NOTE — Telephone Encounter (Signed)
Will fax tramadol. F/u in 1 day

## 2017-01-10 NOTE — Telephone Encounter (Signed)
Pt called stating his swelling and pain has worsened.

## 2017-01-11 ENCOUNTER — Ambulatory Visit (INDEPENDENT_AMBULATORY_CARE_PROVIDER_SITE_OTHER): Payer: 59 | Admitting: Family Medicine

## 2017-01-11 VITALS — BP 137/78 | HR 92 | Wt 288.0 lb

## 2017-01-11 DIAGNOSIS — M7021 Olecranon bursitis, right elbow: Secondary | ICD-10-CM | POA: Diagnosis not present

## 2017-01-11 NOTE — Patient Instructions (Signed)
Thank you for coming in today. Your appt is at 9:45 am Thursday with Dr Thurston HoleWainer at [facility]  [phone].  Call or go to the emergency room if you get worse, have trouble breathing, have chest pains, or palpitations.

## 2017-01-11 NOTE — Progress Notes (Signed)
   Terrance Carlson is a 36 y.o. male who presents to [facility] today for right olecranon bursitis. Patient has had recurrent episodes of olecranon bursitis. He had aspiration and injection on 11/09/2016 and on 12/08/2016. Cultures were negative after both aspirations. He was doing well until a few days ago and noted recurrent swelling. He denies any fevers or chills. He has not tried any further treatment for his right elbow since the swelling started. He notes he cannot tolerate compression since the swelling returned.   No past medical history on file. No past surgical history on file. Social History  Substance Use Topics  . Smoking status: Current Every Day Smoker  . Smokeless tobacco: Not on file  . Alcohol use Not on file     ROS:  As above   Medications: Current Outpatient Prescriptions  Medication Sig Dispense Refill  . traMADol (ULTRAM) 50 MG tablet Take 1 tablet (50 mg total) by mouth every 8 (eight) hours as needed. 15 tablet 0   No current facility-administered medications for this visit.    No Known Allergies   Exam:  BP 137/78   Pulse 92   Wt 288 lb (130.6 kg)   BMI 39.06 kg/m  General: Well Developed, well nourished, and in no acute distress.  Neuro/Psych: Alert and oriented x3, extra-ocular muscles intact, able to move all 4 extremities, sensation grossly intact. Skin: Warm and dry, no rashes noted.  Respiratory: Not using accessory muscles, speaking in full sentences, trachea midline.  Cardiovascular: Pulses palpable, no extremity edema. Abdomen: Does not appear distended. MSK: Significant fluctuant right olecranon bursitis. No skin erythema or tenderness. Normal elbow motion. Normal strength. Intact sensation pulses capillary refill distally.    No results found for this or any previous visit (from the past 48 hour(s)). No results found.    Assessment and Plan: 36 y.o. male with right elbow recurrent  olecranon bursitis. At this point patient has failed aspiration and injection attempt 2. Symptoms are bothersome or refer to orthopedic surgery for consultation regarding definitive surgical management.    Orders Placed This Encounter  Procedures  . AMB referral to orthopedics    Referral Priority:   Routine    Referral Type:   Consultation    Referred to Provider:   Salvatore Marvelobert [facility], MD    Requested Specialty:   Orthopedic Surgery    Number of Visits Requested:   1    Discussed warning signs or symptoms. Please see discharge instructions. Patient expresses understanding.

## 2017-01-12 ENCOUNTER — Ambulatory Visit: Payer: 59 | Admitting: Family Medicine

## 2017-01-13 ENCOUNTER — Ambulatory Visit: Payer: 59 | Admitting: Family Medicine

## 2017-05-05 ENCOUNTER — Telehealth: Payer: Self-pay

## 2017-05-05 NOTE — Telephone Encounter (Signed)
Pt called an is requesting a script for chantix.  He would like to quit smoking.  Please advise.

## 2017-05-06 MED ORDER — VARENICLINE TARTRATE 0.5 MG X 11 & 1 MG X 42 PO MISC: ORAL | 0 refills | Status: DC

## 2017-05-06 NOTE — Telephone Encounter (Signed)
Chantix sent to pharmacy Recheck in about a month to discuss tobacco cessation

## 2017-05-06 NOTE — Telephone Encounter (Signed)
Message left on vm 

## 2017-05-12 ENCOUNTER — Telehealth: Payer: Self-pay

## 2017-05-13 MED ORDER — ONDANSETRON HCL 8 MG PO TABS: 8.0000 mg | ORAL_TABLET | Freq: Three times a day (TID) | ORAL | 6 refills | Status: DC | PRN

## 2017-05-13 NOTE — Telephone Encounter (Signed)
Pt called requesting a rx for zofran for nausea caused by chantix. Please advise.

## 2017-05-13 NOTE — Telephone Encounter (Signed)
Zofran sent in

## 2017-05-18 NOTE — Telephone Encounter (Signed)
Done

## 2017-05-31 ENCOUNTER — Ambulatory Visit (INDEPENDENT_AMBULATORY_CARE_PROVIDER_SITE_OTHER): Payer: 59 | Admitting: Family Medicine

## 2017-05-31 ENCOUNTER — Encounter: Payer: Self-pay | Admitting: Family Medicine

## 2017-05-31 VITALS — BP 129/78 | HR 73 | Temp 98.2°F | Wt 278.0 lb

## 2017-05-31 DIAGNOSIS — G4733 Obstructive sleep apnea (adult) (pediatric): Secondary | ICD-10-CM

## 2017-05-31 DIAGNOSIS — W57XXXA Bitten or stung by nonvenomous insect and other nonvenomous arthropods, initial encounter: Secondary | ICD-10-CM

## 2017-05-31 DIAGNOSIS — R0683 Snoring: Secondary | ICD-10-CM

## 2017-05-31 DIAGNOSIS — E781 Pure hyperglyceridemia: Secondary | ICD-10-CM | POA: Diagnosis not present

## 2017-05-31 DIAGNOSIS — E559 Vitamin D deficiency, unspecified: Secondary | ICD-10-CM | POA: Diagnosis not present

## 2017-05-31 DIAGNOSIS — R739 Hyperglycemia, unspecified: Secondary | ICD-10-CM | POA: Diagnosis not present

## 2017-05-31 DIAGNOSIS — R202 Paresthesia of skin: Secondary | ICD-10-CM | POA: Diagnosis not present

## 2017-05-31 HISTORY — DX: Obstructive sleep apnea (adult) (pediatric): G47.33

## 2017-05-31 LAB — CBC
HCT: 47.3 % (ref 38.5–50.0)
Hemoglobin: 16.4 g/dL (ref 13.2–17.1)
MCH: 30.6 pg (ref 27.0–33.0)
MCHC: 34.7 g/dL (ref 32.0–36.0)
MCV: 88.2 fL (ref 80.0–100.0)
MPV: 10 fL (ref 7.5–12.5)
PLATELETS: 302 10*3/uL (ref 140–400)
RBC: 5.36 MIL/uL (ref 4.20–5.80)
RDW: 14.1 % (ref 11.0–15.0)
WBC: 11.6 10*3/uL — AB (ref 3.8–10.8)

## 2017-05-31 MED ORDER — DOXYCYCLINE HYCLATE 100 MG PO TABS: 100.0000 mg | ORAL_TABLET | Freq: Two times a day (BID) | ORAL | 0 refills | Status: DC

## 2017-05-31 NOTE — Patient Instructions (Signed)
Thank you for coming in today. Get labs today.  Take doxycycline twice daily for 1 week.  Make sure to use sunscreen as doxycycline can make it easier to have a sun burn.   Let me know if the tingling does not get better.   You should also hear drom Porfirio OarMike Smagner at [facility] about the home sleep test. Call him at [phone] if you do not hear form him by Monday.    Paresthesia Paresthesia is a burning or prickling feeling. This feeling can happen in any part of the body. It often happens in the hands, arms, legs, or feet. Usually, it is not painful. In most cases, the feeling goes away in a short time and is not a sign of a serious problem. Follow these instructions at home:  Avoid drinking alcohol.  Try massage or needle therapy (acupuncture) to help with your problems.  Keep all follow-up visits as told by your doctor. This is important. Contact a doctor if:  You keep on having episodes of paresthesia.  Your burning or prickling feeling gets worse when you walk.  You have pain or cramps.  You feel dizzy.  You have a rash. Get help right away if:  You feel weak.  You have trouble walking or moving.  You have problems speaking, understanding, or seeing.  You feel confused.  You cannot control when you pee (urinate) or poop (bowel movement).  You lose feeling (numbness) after an injury.  You pass out (faint). This information is not intended to replace advice given to you by your health care provider. Make sure you discuss any questions you have with your health care provider. Document Released: 11/18/2008 Document Revised: 05/13/2016 Document Reviewed: 12/02/2014 Elsevier Interactive Patient Education  2018 [employer]    Tick Bite Information Introduction Ticks are insects that attach themselves to the skin. There are many types of ticks. Common types include wood ticks and deer ticks. Sometimes, ticks carry diseases that can make a person  very ill. The most common places for ticks to attach themselves are the scalp, neck, armpits, waist, and groin. HOW CAN YOU PREVENT TICK BITES? Take these steps to help prevent tick bites when you are outdoors:  Wear long sleeves and long pants.  Wear white clothes so you can see ticks more easily.  Tuck your pant legs into your socks.  If walking on a trail, stay in the middle of the trail to avoid brushing against bushes.  Avoid walking through areas with long grass.  Put bug spray on all skin that is showing and along boot tops, pant legs, and sleeve cuffs.  Check clothes, hair, and skin often and before going inside.  Brush off any ticks that are not attached.  Take a shower or bath as soon as possible after being outdoors.  HOW SHOULD YOU REMOVE A TICK? Ticks should be removed as soon as possible to help prevent diseases. 1. If latex gloves are available, put them on before trying to remove a tick. 2. Use tweezers to grasp the tick as close to the skin as possible. You may also use curved forceps or a tick removal tool. Grasp the tick as close to its head as possible. Avoid grasping the tick on its body. 3. Pull gently upward until the tick lets go. Do not twist the tick or jerk it suddenly. This may break off the tick's head or mouth parts. 4. Do not squeeze or crush the tick's body. This could  force disease-carrying fluids from the tick into your body. 5. After the tick is removed, wash the bite area and your hands with soap and water or alcohol. 6. Apply a small amount of antiseptic cream or ointment to the bite site. 7. Wash any tools that were used.  Do not try to remove a tick by applying a hot match, petroleum jelly, or fingernail polish to the tick. These methods do not work. They may also increase the chances of disease being spread from the tick bite. WHEN SHOULD YOU SEEK HELP? Contact your health care provider if you are unable to remove a tick or if a part of the  tick breaks off in the skin. After a tick bite, you need to watch for signs and symptoms of diseases that can be spread by ticks. Contact your health care provider if you develop any of the following:  Fever.  Rash.  Redness and puffiness (swelling) in the area of the tick bite.  Tender, puffy lymph glands.  Watery poop (diarrhea).  Weight loss.  Cough.  Feeling more tired than normal (fatigue).  Muscle, joint, or bone pain.  Belly (abdominal) pain.  Headache.  Change in your level of consciousness.  Trouble walking or moving your legs.  Loss of feeling (numbness) in the legs.  Loss of movement (paralysis).  Shortness of breath.  Confusion.  Throwing up (vomiting) many times.  This information is not intended to replace advice given to you by your health care provider. Make sure you discuss any questions you have with your health care provider. Document Released: 03/02/2010 Document Revised: 05/13/2016 Document Reviewed: 05/16/2013 Elsevier Interactive Patient Education  [employer].

## 2017-05-31 NOTE — Progress Notes (Signed)
Terrance Carlson is a 36 y.o. male who [facility] today for discuss tick bite, paresthesias snoring and obesity.  Tick bite: Patient found a tick on the top of his head yesterday. He estimates it had been there for less than 24 hours although he is not sure. Previous to this he had been feeling a little bit fatigued with little bit of cough. He is concerned he may be having a tickborne illness. He denies any rash.  He does however note bothersome paresthesias. He describes numbness and tingling into his hands and feet bilaterally. He notes this mostly is decreased sensation in the fingertips of his hands and his toes. He notes the distribution occurs in all 5 fingers bilaterally. This is not worsened or improved with any activity. This is been ongoing now for about a week. He is worried he may have a metabolic problem like diabetes.  Snoring: Patient previously was found to have snoring and had a concern for sleep apnea. He notes this problem has persisted despite some mild weight loss. He would like to have this evaluated as well.  Obesity: Patient has lost a little weight recently. He has reduced his calories and is exercising a little bit more.   Past Medical History:  Diagnosis Date  . High triglycerides 04/29/2016  . History of melanoma 04/22/2016   Left abdomen status post excision in 2013.   [name]. Hyperglycemia 04/29/2016   Past Surgical History:  Procedure Laterality Date  . OLECRANON BURSECTOMY Right 2017   Social History  Substance Use Topics  . Smoking status: Current Every Day Smoker  . Smokeless tobacco: Never Used  . Alcohol use Not on file   family history is not on file.  ROS as above:  Medications: Current Outpatient Prescriptions  Medication Sig Dispense Refill  . Cholecalciferol (VITAMIN D3) 5000 UNIT/ML LIQD Take by mouth.    . Omega-3  Fatty Acids (FISH OIL) 1000 MG CAPS Take 1,000 mg by mouth daily.    [name]. doxycycline (VIBRA-TABS) 100 MG tablet Take 1 tablet (100 mg total) by mouth 2 (two) times daily. 14 tablet 0   No current facility-administered medications for this visit.    No Known Allergies  Health Maintenance Health Maintenance  Topic Date Due  . HIV Screening  03/27/1995  . TETANUS/TDAP  03/27/1999  . INFLUENZA VACCINE  07/20/2017     Exam:  BP 129/78   Pulse 73   Temp 98.2 F (36.8 C) (Oral)   Wt 278 lb (126.1 kg)   SpO2 96%   BMI 37.70 kg/m   Wt Readings from Last 10 Encounters:  05/31/17 278 lb (126.1 kg)  01/11/17 288 lb (130.6 kg)  12/16/16 279 lb 14.4 oz (127 kg)  12/08/16 284 lb (128.8 kg)  11/09/16 278 lb (126.1 kg)  07/29/16 272 lb (123.4 kg)  04/22/16 282 lb (127.9 kg)    Gen: Well NAD HEENT: EOMI,  MMM No goiter Lungs: Normal work of breathing. CTABL Heart: RRR no MRG Abd: NABS, Soft. Nondistended, Nontender Exts: Brisk capillary refill, warm and well perfused.  Skin: Tiny erythematous papule at the top of his scalp. Neuro alert and oriented normal coordination balance and gait. Normal cranial nerve exam. Strength and sensation are intact. Patient has slight decreased sensation of the tips of his fingers laterally. He does have a positive Tinel's at the carpal tunnel and cubital tunnel bilaterally. Normal gait and balance  No results found for this or any previous visit (from the past 72 hour(s)). No results found.    Assessment and Plan: 36 y.o. male with  Tick bite with some symptoms. Doubtful for tickborne illness although I think treated with doxycycline is reasonable. We'll continue to follow.  Paresthesias: Unclear etiology here. This may be both carpal tunnel and cubital tunnel syndrome at the same time although that does not explain his foot involvement. Plan for a limited metabolic workup listed below. We'll continue to follow along if continuing to be  symptomatic.  Obesity: Patient is losing weight which is excellent. We'll continue to follow along.  Snoring: Concerning for sleep apnea. Sleep study ordered.   Orders Placed This Encounter  Procedures  . CBC  . COMPLETE METABOLIC PANEL WITH GFR  . Hemoglobin A1c  . TSH  . VITAMIN D 25 Hydroxy (Vit-D Deficiency, Fractures)  . Vitamin B12  . RPR  . HIV antibody  . Home sleep test    Standing Status:   Future    Standing Expiration Date:   05/31/2018    Scheduling Instructions:     [facility]      [phone]     Fax [phone]    Order Specific Question:   Where should this test be performed:    Answer:   Other   Meds ordered this encounter  Medications  . Omega-3 Fatty Acids (FISH OIL) 1000 MG CAPS    Sig: Take 1,000 mg by mouth daily.  . Cholecalciferol (VITAMIN D3) 5000 UNIT/ML LIQD    Sig: Take by mouth.  . doxycycline (VIBRA-TABS) 100 MG tablet    Sig: Take 1 tablet (100 mg total) by mouth 2 (two) times daily.    Dispense:  14 tablet    Refill:  0     Discussed warning signs or symptoms. Please see discharge instructions. Patient expresses understanding.  I spent 40 minutes with this patient, greater than 50% was face-to-face time counseling regarding the above diagnosis.

## 2017-06-01 LAB — COMPLETE METABOLIC PANEL WITH GFR
ALT: 36 U/L (ref 9–46)
AST: 20 U/L (ref 10–40)
Albumin: 4.6 g/dL (ref 3.6–5.1)
Alkaline Phosphatase: 73 U/L (ref 40–115)
BUN: 12 mg/dL (ref 7–25)
CHLORIDE: 103 mmol/L (ref 98–110)
CO2: 24 mmol/L (ref 20–31)
CREATININE: 0.92 mg/dL (ref 0.60–1.35)
Calcium: 9.6 mg/dL (ref 8.6–10.3)
GFR, Est Non African American: >89 mL/min (ref >=60)
Glucose, Bld: 98 mg/dL (ref 65–99)
POTASSIUM: 4.5 mmol/L (ref 3.5–5.3)
Sodium: 138 mmol/L (ref 135–146)
Total Bilirubin: 0.6 mg/dL (ref 0.2–1.2)
Total Protein: 7.1 g/dL (ref 6.1–8.1)

## 2017-06-01 LAB — VITAMIN D 25 HYDROXY (VIT D DEFICIENCY, FRACTURES): Vit D, 25-Hydroxy: 25 ng/mL — ABNORMAL LOW (ref 30–100)

## 2017-06-01 LAB — HIV ANTIBODY (ROUTINE TESTING W REFLEX): HIV: NONREACTIVE

## 2017-06-01 LAB — TSH: TSH: 1.2 m[IU]/L (ref 0.40–4.50)

## 2017-06-01 LAB — HEMOGLOBIN A1C
Hgb A1c MFr Bld: 5.5 % (ref <5.7)
MEAN PLASMA GLUCOSE: 111 mg/dL

## 2017-06-01 LAB — RPR

## 2017-06-01 LAB — VITAMIN B12: Vitamin B-12: 357 pg/mL (ref 200–1100)

## 2017-06-02 ENCOUNTER — Ambulatory Visit: Payer: 59 | Admitting: Family Medicine

## 2017-06-06 ENCOUNTER — Telehealth: Payer: Self-pay

## 2017-06-06 MED ORDER — VARENICLINE TARTRATE 1 MG PO TABS: 1.0000 mg | ORAL_TABLET | Freq: Two times a day (BID) | ORAL | 3 refills | Status: DC

## 2017-06-06 NOTE — Telephone Encounter (Signed)
Done

## 2017-06-06 NOTE — Telephone Encounter (Signed)
Pt called stating that he is on the last few pills of the chantix starter pack and would like a rx to the pharmacy on file.

## 2017-06-07 NOTE — Telephone Encounter (Signed)
Left detailed vm with recommendations. Requested a call back with concerns.  

## 2017-06-09 ENCOUNTER — Ambulatory Visit (INDEPENDENT_AMBULATORY_CARE_PROVIDER_SITE_OTHER): Payer: 59 | Admitting: Family Medicine

## 2017-06-09 ENCOUNTER — Encounter: Payer: Self-pay | Admitting: Family Medicine

## 2017-06-09 VITALS — BP 126/88 | HR 86 | Wt 279.0 lb

## 2017-06-09 DIAGNOSIS — R202 Paresthesia of skin: Secondary | ICD-10-CM

## 2017-06-09 MED ORDER — GABAPENTIN 300 MG PO CAPS: ORAL_CAPSULE | ORAL | 3 refills | Status: DC

## 2017-06-09 NOTE — Progress Notes (Signed)
Terrance Carlson is a 36 y.o. male who presents to [name] today for follow-up paresthesias and discuss sleep study.  Patient was seen on June 12 for several issues including paresthesias to his bilateral upper and lower extremities. He had a limited laboratory workup which was unremarkable. Since then his symptoms have worsened. He notes numbness and tingling in his hands bilaterally right worse than left. Additionally he notes the same numbness and tingling into his toes bilaterally. He notes the symptoms will occasionally wake him up at night especially into his right hand. He notes he can pinpoint the numbness and tingling in his right hand to the first 3 digits and to a lesser degree to the left hand as well. He cannot pinpoint any distribution to his feet and legs.  Additionally he was thought to potentially have sleep apnea at the last visit as well. A sleep study was ordered however he has not been contacted by the home sleep study provider.   Past Medical History:  Diagnosis Date  . High triglycerides 04/29/2016  . History of melanoma 04/22/2016   Left abdomen status post excision in 2013.   [name] Hyperglycemia 04/29/2016   Past Surgical History:  Procedure Laterality Date  . OLECRANON BURSECTOMY Right 2017   Social History  Substance Use Topics  . Smoking status: Current Every Day Smoker  . Smokeless tobacco: Never Used  . Alcohol use Not on file   family history is not on file.  ROS as above:  Medications: Current Outpatient Prescriptions  Medication Sig Dispense Refill  . Cholecalciferol (VITAMIN D3) 5000 UNIT/ML LIQD Take by mouth.    . gabapentin (NEURONTIN) 300 MG capsule One tab PO qHS for a week, then BID for a week, then TID. May double weekly to a max of 3,600mg /day 180 capsule 3  . Omega-3 Fatty Acids (FISH OIL) 1000 MG CAPS Take 1,000 mg by mouth  daily.    . varenicline (CHANTIX) 1 MG tablet Take 1 tablet (1 mg total) by mouth 2 (two) times daily. 60 tablet 3   No current facility-administered medications for this visit.    No Known Allergies  Health Maintenance Health Maintenance  Topic Date Due  . TETANUS/TDAP  03/27/1999  . INFLUENZA VACCINE  07/20/2017  . HIV Screening  Completed     Exam:  BP 126/88   Pulse 86   Wt 279 lb (126.6 kg)   BMI 37.84 kg/m  Gen: Well NAD HEENT: EOMI,  MMM Lungs: Normal work of breathing. CTABL Heart: RRR no MRG Abd: NABS, Soft. Nondistended, Nontender Exts: Brisk capillary refill, warm and well perfused.  Hands normal-appearing bilaterally with no thenar or hyperthenar atrophy. Grip strength is intact bilaterally. Sensation is slightly diminished bilaterally right worse than left. Positive Tinel's and Phalen's test right hand. Negative Tinel's but positive Phalen's test left hand. Normal gait.    Chemistry      Component Value Date/Time   NA 138 05/31/2017 1120   K 4.5 05/31/2017 1120   CL 103 05/31/2017 1120   CO2 24 05/31/2017 1120   BUN 12 05/31/2017 1120   CREATININE 0.92 05/31/2017 1120      Component Value Date/Time   CALCIUM 9.6 05/31/2017 1120   ALKPHOS 73 05/31/2017 1120   AST 20 05/31/2017 1120   ALT 36 05/31/2017 1120   BILITOT 0.6 05/31/2017 1120     Lab Results  Component Value Date   WBC 11.6 (  H) 05/31/2017   HGB 16.4 05/31/2017   HCT 47.3 05/31/2017   MCV 88.2 05/31/2017   PLT 302 05/31/2017   Lab Results  Component Value Date   VITAMINB12 357 05/31/2017   Lab Results  Component Value Date   TSH 1.20 05/31/2017      No results found for this or any previous visit (from the past 72 hour(s)). No results found.    Assessment and Plan: 36 y.o. male with  Paresthesias bilateral upper and lower extremities. I'm suspicious for carpal tunnel syndrome of the hands bilaterally. The distribution is consistent with median nerve involvement and he  has positive provocative testing including Tinel's and Phalen's tests. However carpal  tunnel syndrome does not explain his lower extremity symptoms.  Plan for limited workup. Will broaden the lab workup to include methylmalonic acid. The B-12 was potentially slightly low and we may get some more sensitivity out of methylmalonic acid. We'll also check vitamin D 6. Most importantly will order nerve conduction study which I think will be helpful.  Additionally we will start some empiric treatment including night splints bilaterally and gabapentin.  As for the sleep study we will recheck with scheduling about sleep study.  Recheck in a few weeks.   Orders Placed This Encounter  Procedures  . Methylmalonic acid, serum  . Vitamin B6  . Nerve conduction test    Standing Status:   Future    Standing Expiration Date:   06/09/2018    Scheduling Instructions:     [place]eng Bai [place]. Include diagnostic tests to evaluate BL hand and feet numbness. Suspect BL R>L carpal tunnel syndrome.    Order Specific Question:   Where should this test be performed?    Answer:   other   Meds ordered this encounter  Medications  . gabapentin (NEURONTIN) 300 MG capsule    Sig: One tab PO qHS for a week, then BID for a week, then TID. May double weekly to a max of 3,600mg /day    Dispense:  180 capsule    Refill:  3     Discussed warning signs or symptoms. Please see discharge instructions. Patient expresses understanding.

## 2017-06-09 NOTE — Patient Instructions (Signed)
Thank you for coming in today. Start gabapentin.  You should hear about the nerve conduction study.  Let me know if you do not hear anything.  Use the night splints.  Recheck in 3-6 weeks.    Carpal Tunnel Syndrome Carpal tunnel syndrome is a condition that causes pain in your hand and arm. The carpal tunnel is a narrow area located on the palm side of your wrist. Repeated wrist motion or certain diseases may cause swelling within the tunnel. This swelling pinches the main nerve in the wrist (median nerve). What are the causes? This condition may be caused by:  Repeated wrist motions.  Wrist injuries.  Arthritis.  A cyst or tumor in the carpal tunnel.  Fluid buildup during pregnancy.  Sometimes the cause of this condition is not known. What increases the risk? This condition is more likely to develop in:  People who have jobs that cause them to repeatedly move their wrists in the same motion, such as Health visitorbutchers and cashiers.  Women.  People with certain conditions, such as: ? Diabetes. ? Obesity. ? An underactive thyroid (hypothyroidism). ? Kidney failure.  What are the signs or symptoms? Symptoms of this condition include:  A tingling feeling in your fingers, especially in your thumb, index, and middle fingers.  Tingling or numbness in your hand.  An aching feeling in your entire arm, especially when your wrist and elbow are bent for long periods of time.  Wrist pain that goes up your arm to your shoulder.  Pain that goes down into your palm or fingers.  A weak feeling in your hands. You may have trouble grabbing and holding items.  Your symptoms may feel worse during the night. How is this diagnosed? This condition is diagnosed with a medical history and physical exam. You may also have tests, including:  An electromyogram (EMG). This test measures electrical signals sent by your nerves into the muscles.  X-rays.  How is this treated? Treatment for this  condition includes:  Lifestyle changes. It is important to stop doing or modify the activity that caused your condition.  Physical or occupational therapy.  Medicines for pain and inflammation. This may include medicine that is injected into your wrist.  A wrist splint.  Surgery.  Follow these instructions at home: If you have a splint:  Wear it as told by your health care provider. Remove it only as told by your health care provider.  Loosen the splint if your fingers become numb and tingle, or if they turn cold and blue.  Keep the splint clean and dry. General instructions  Take over-the-counter and prescription medicines only as told by your health care provider.  Rest your wrist from any activity that may be causing your pain. If your condition is work related, talk to your employer about changes that can be made, such as getting a wrist pad to use while typing.  If directed, apply ice to the painful area: ? Put ice in a plastic bag. ? Place a towel between your skin and the bag. ? Leave the ice on for 20 minutes, 2-3 times per day.  Keep all follow-up visits as told by your health care provider. This is important.  Do any exercises as told by your health care provider, physical therapist, or occupational therapist. Contact a health care provider if:  You have new symptoms.  Your pain is not controlled with medicines.  Your symptoms get worse. This information is not intended to replace advice given  to you by your health care provider. Make sure you discuss any questions you have with your health care provider. Document Released: 12/03/2000 Document Revised: 04/15/2016 Document Reviewed: 04/23/2015 Elsevier Interactive Patient Education  2017 [employer].

## 2017-06-12 LAB — VITAMIN B6: VITAMIN B6: 20.3 ng/mL (ref 2.1–21.7)

## 2017-06-13 LAB — METHYLMALONIC ACID, SERUM: METHYLMALONIC ACID, QUANT: 75 nmol/L — AB (ref 87–318)

## 2017-06-14 ENCOUNTER — Telehealth: Payer: Self-pay | Admitting: Family Medicine

## 2017-06-14 DIAGNOSIS — R202 Paresthesia of skin: Secondary | ICD-10-CM

## 2017-06-15 NOTE — Addendum Note (Signed)
Addended by: Rodolph BongOREY, Jann Milkovich S on: 06/15/2017 07:57 AM   Modules accepted: Orders

## 2017-06-23 NOTE — Telephone Encounter (Signed)
Note opened in error.

## 2017-06-28 ENCOUNTER — Encounter: Payer: Self-pay | Admitting: Family Medicine

## 2017-07-01 ENCOUNTER — Encounter: Payer: Self-pay | Admitting: Family Medicine

## 2017-07-01 ENCOUNTER — Telehealth: Payer: Self-pay | Admitting: Family Medicine

## 2017-07-01 DIAGNOSIS — G4733 Obstructive sleep apnea (adult) (pediatric): Secondary | ICD-10-CM

## 2017-07-01 MED ORDER — AMBULATORY NON FORMULARY MEDICATION: 0 refills | Status: DC

## 2017-07-01 NOTE — Telephone Encounter (Signed)
I called and left a message regarding sleep study results showing mild sleep apnea. We'll start CPAP trial

## 2017-08-11 IMAGING — DX DG ELBOW COMPLETE 3+V*R*
4 series · 4 of 4 positions shown · non-contrast
Comparison: None.

CLINICAL DATA: Right elbow pain in olecranon swelling.

EXAM:
RIGHT ELBOW - COMPLETE 3+ VIEW

[elbow ap]
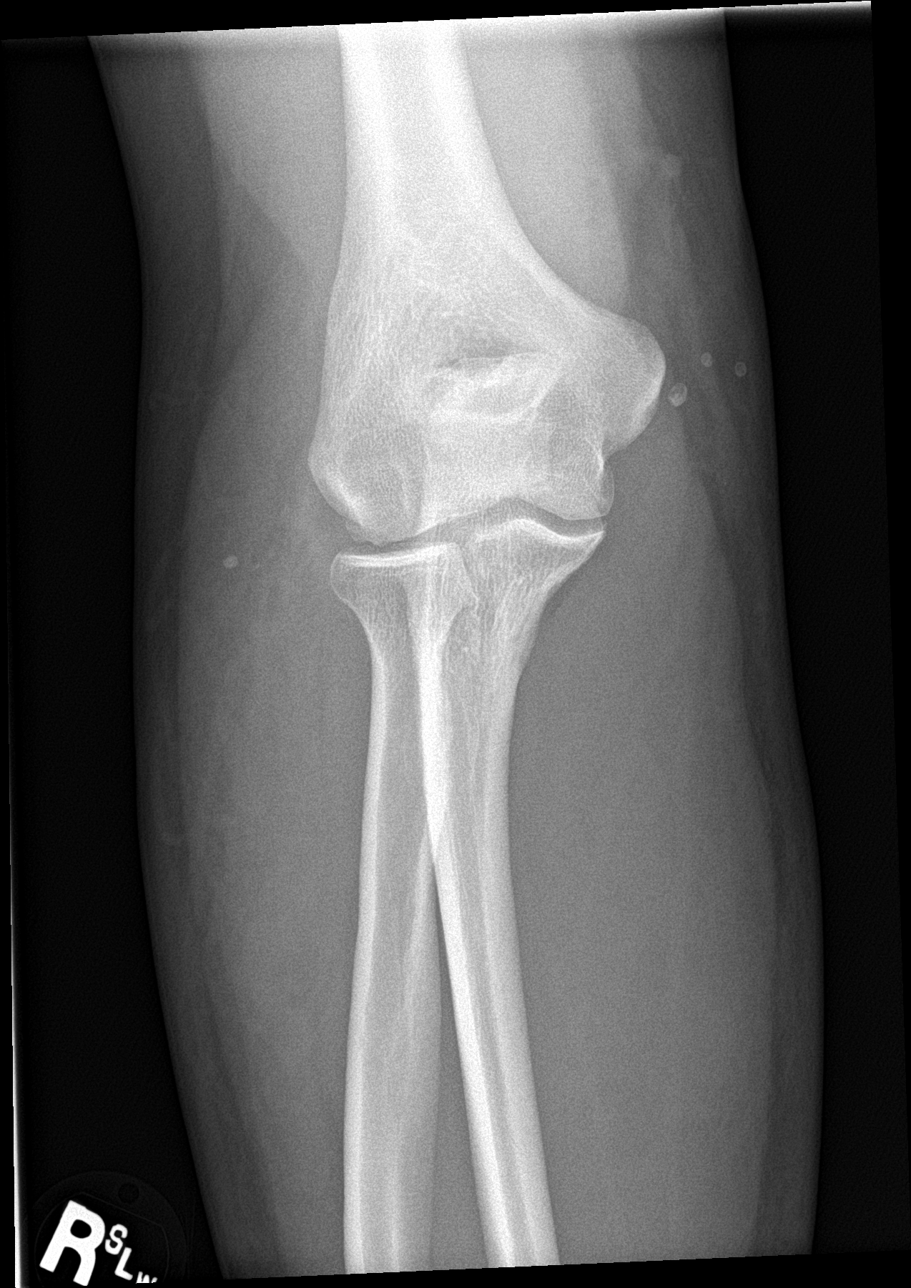

[elbow obl (1 of 2)]
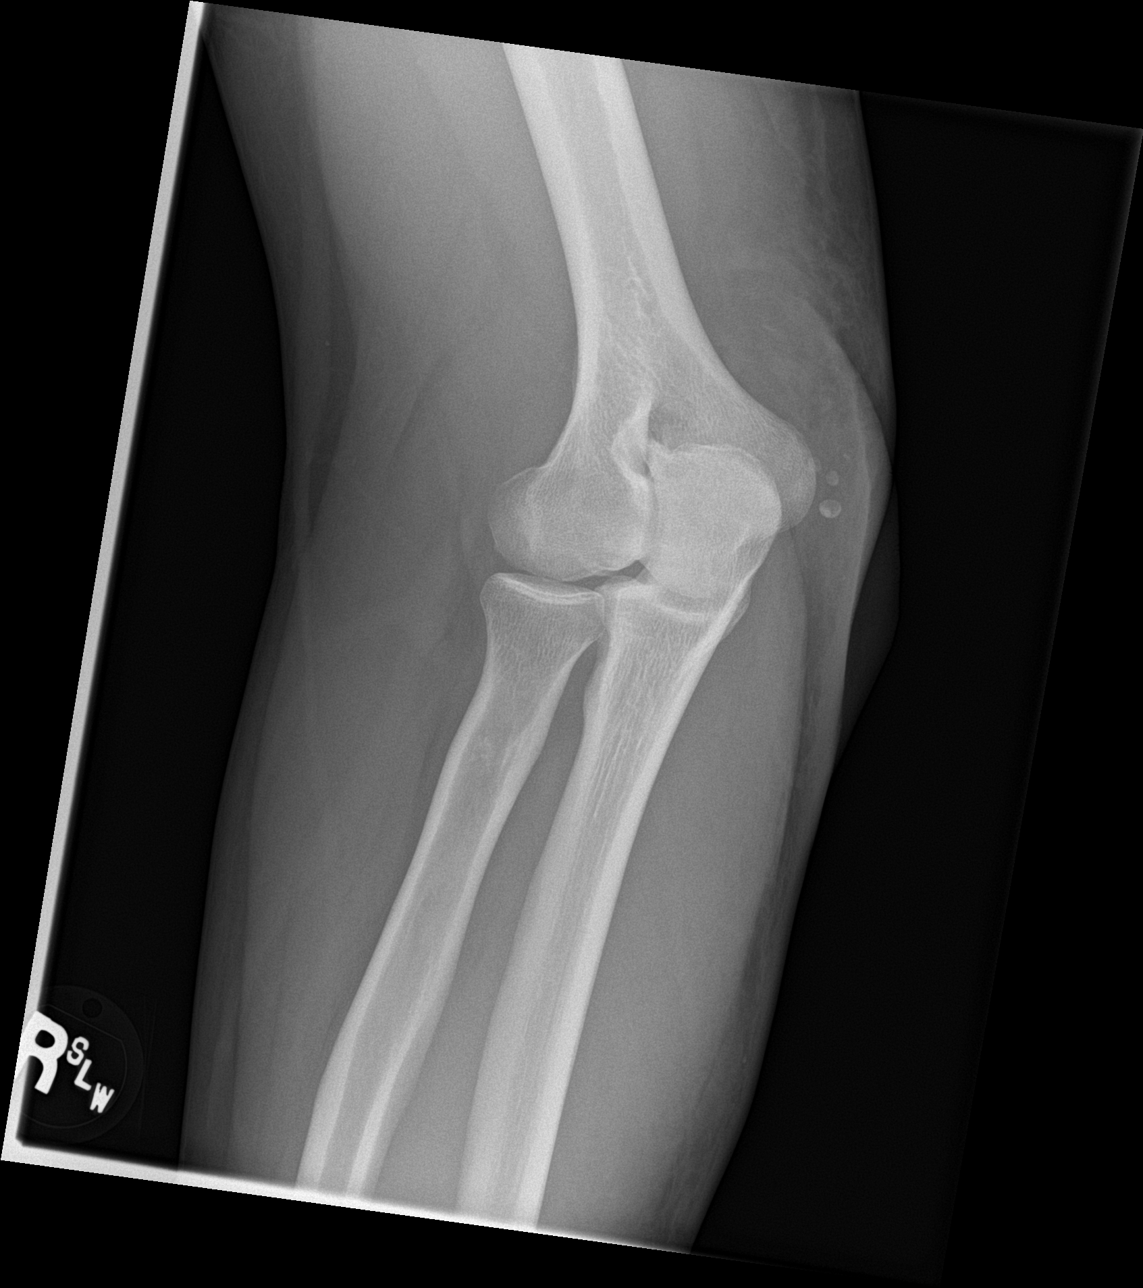

[elbow obl (2 of 2)]
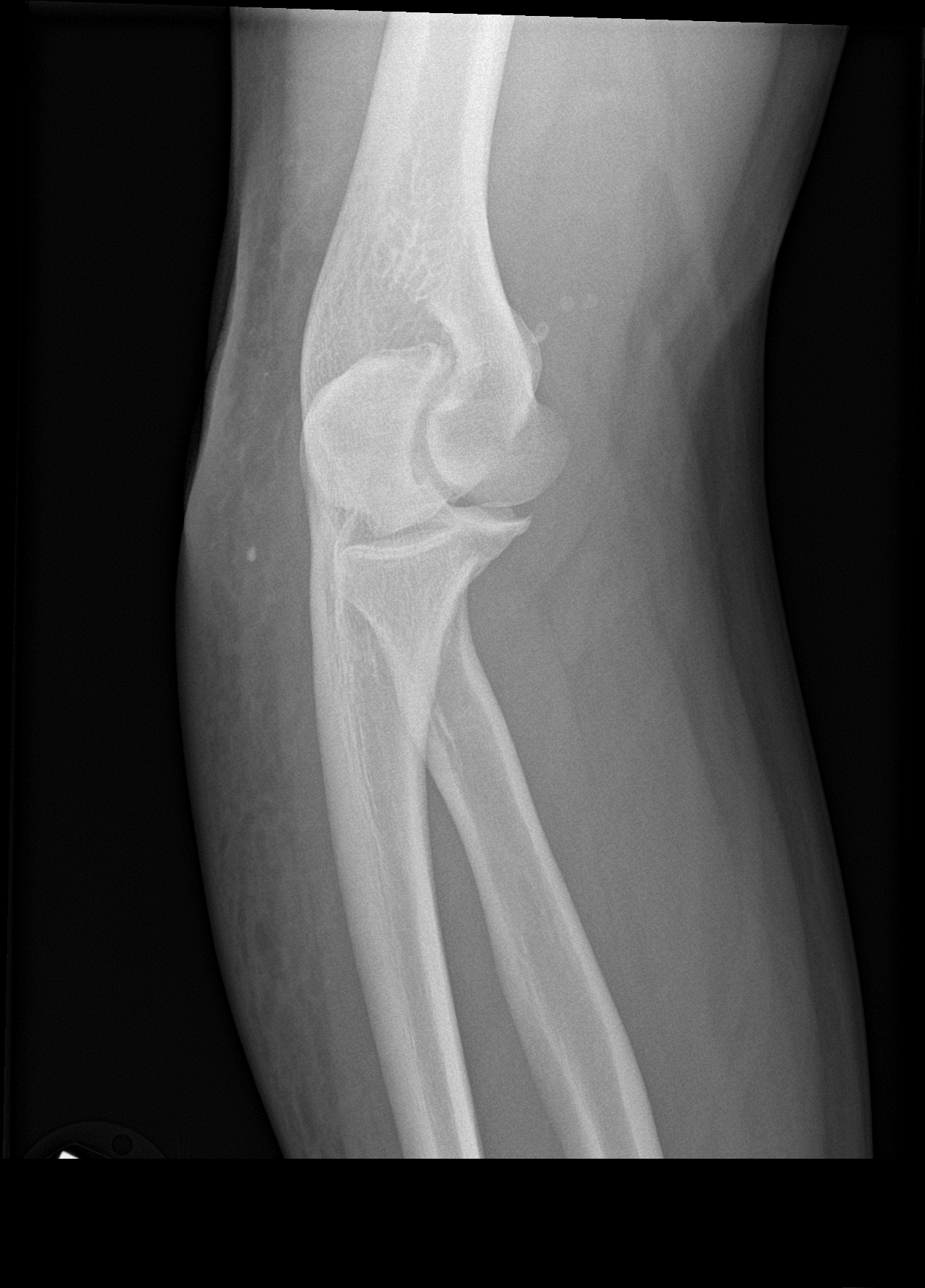

[elbow lat]
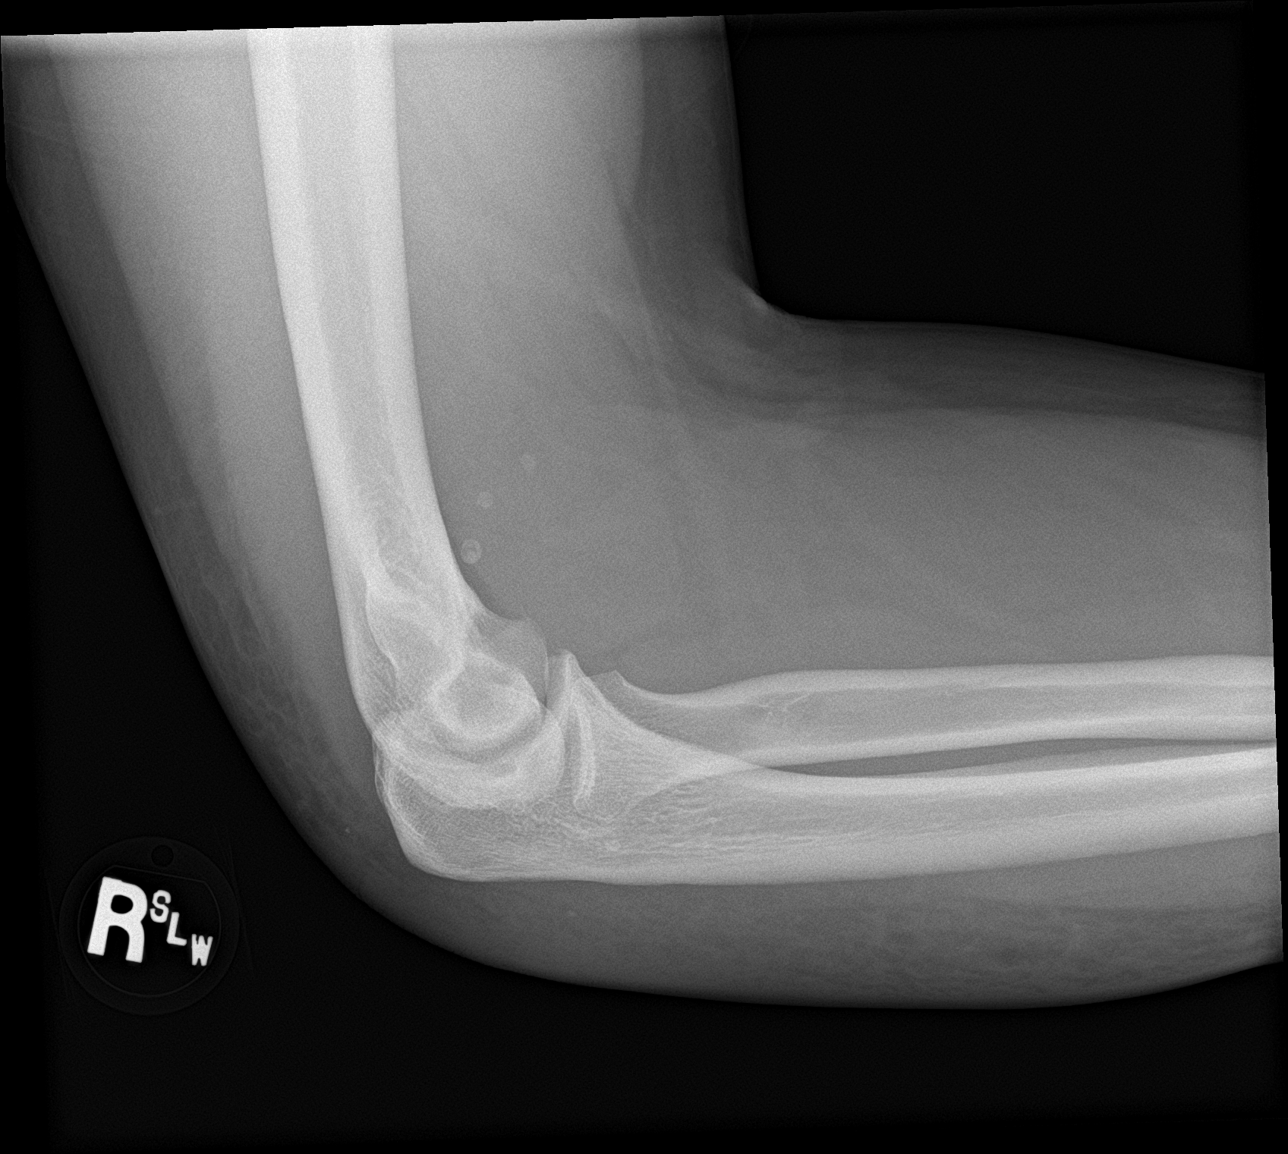

[4 of 4 positions shown; findings below may reference images not displayed]

FINDINGS: No fracture. No fat pad elevation to suggest joint effusion. No
subluxation dislocation. There is some spurring at the coronoid
process and cortical irregularity noted in the capitellum. Benign
appearing calcifications in the region of the antecubital fossa are
likely vascular in etiology. Subcutaneous edema is noted over the
posterior elbow.
IMPRESSION: Mild degenerative changes without acute bony findings.

Posterior subcutaneous edema.

## 2017-08-12 DIAGNOSIS — G6181 Chronic inflammatory demyelinating polyneuritis: Secondary | ICD-10-CM

## 2017-08-12 HISTORY — DX: Chronic inflammatory demyelinating polyneuritis: G61.81

## 2017-08-19 ENCOUNTER — Ambulatory Visit (INDEPENDENT_AMBULATORY_CARE_PROVIDER_SITE_OTHER): Payer: 59 | Admitting: Family Medicine

## 2017-08-19 ENCOUNTER — Encounter: Payer: Self-pay | Admitting: Family Medicine

## 2017-08-19 VITALS — BP 124/80 | HR 102 | Wt 281.0 lb

## 2017-08-19 DIAGNOSIS — G6289 Other specified polyneuropathies: Secondary | ICD-10-CM | POA: Diagnosis not present

## 2017-08-19 DIAGNOSIS — F321 Major depressive disorder, single episode, moderate: Secondary | ICD-10-CM | POA: Diagnosis not present

## 2017-08-19 MED ORDER — SERTRALINE HCL 25 MG PO TABS: ORAL_TABLET | ORAL | 2 refills | Status: DC

## 2017-08-19 NOTE — Patient Instructions (Signed)
Thank you for coming in today. Start zoloft daily.  Increase to 2 pills daily in 1 week.  Recheck with me in 2 weeks.   Sertraline tablets What is this medicine? SERTRALINE (SER tra leen) is used to treat depression. It may also be used to treat obsessive compulsive disorder, panic disorder, post-trauma stress, premenstrual dysphoric disorder (PMDD) or social anxiety. This medicine may be used for other purposes; ask your health care provider or pharmacist if you have questions. COMMON BRAND NAME(S): Zoloft What should I tell my health care provider before I take this medicine? They need to know if you have any of these conditions: -bleeding disorders -bipolar disorder or a family history of bipolar disorder -glaucoma -heart disease -high blood pressure -history of irregular heartbeat -history of low levels of calcium, magnesium, or potassium in the blood -if you often drink alcohol -liver disease -receiving electroconvulsive therapy -seizures -suicidal thoughts, plans, or attempt; a previous suicide attempt by you or a family member -take medicines that treat or prevent blood clots -thyroid disease -an unusual or allergic reaction to sertraline, other medicines, foods, dyes, or preservatives -pregnant or trying to get pregnant -breast-feeding How should I use this medicine? Take this medicine by mouth with a glass of water. Follow the directions on the prescription label. You can take it with or without food. Take your medicine at regular intervals. Do not take your medicine more often than directed. Do not stop taking this medicine suddenly except upon the advice of your doctor. Stopping this medicine too quickly may cause serious side effects or your condition may worsen. A special MedGuide will be given to you by the pharmacist with each prescription and refill. Be sure to read this information carefully each time. Talk to your pediatrician regarding the use of this medicine in  children. While this drug may be prescribed for children as young as 7 years for selected conditions, precautions do apply. Overdosage: If you think you have taken too much of this medicine contact a poison control center or emergency room at once. NOTE: This medicine is only for you. Do not share this medicine with others. What if I miss a dose? If you miss a dose, take it as soon as you can. If it is almost time for your next dose, take only that dose. Do not take double or extra doses. What may interact with this medicine? Do not take this medicine with any of the following medications: -cisapride -dofetilide -dronedarone -linezolid -MAOIs like Carbex, Eldepryl, Marplan, Nardil, and Parnate -methylene blue (injected into a vein) -pimozide -thioridazine This medicine may also interact with the following medications: -alcohol -amphetamines -aspirin and aspirin-like medicines -certain medicines for depression, anxiety, or psychotic disturbances -certain medicines for fungal infections like ketoconazole, fluconazole, posaconazole, and itraconazole -certain medicines for irregular heart beat like flecainide, quinidine, propafenone -certain medicines for migraine headaches like almotriptan, eletriptan, frovatriptan, naratriptan, rizatriptan, sumatriptan, zolmitriptan -certain medicines for sleep -certain medicines for seizures like carbamazepine, valproic acid, phenytoin -certain medicines that treat or prevent blood clots like warfarin, enoxaparin, dalteparin -cimetidine -digoxin -diuretics -fentanyl -isoniazid -lithium -NSAIDs, medicines for pain and inflammation, like ibuprofen or naproxen -other medicines that prolong the QT interval (cause an abnormal heart rhythm) -rasagiline -safinamide -supplements like St. John's wort, kava kava, valerian -tolbutamide -tramadol -tryptophan This list may not describe all possible interactions. Give your health care provider a list of all  the medicines, herbs, non-prescription drugs, or dietary supplements you use. Also tell them if you smoke, drink  alcohol, or use illegal drugs. Some items may interact with your medicine. What should I watch for while using this medicine? Tell your doctor if your symptoms do not get better or if they get worse. Visit your doctor or health care professional for regular checks on your progress. Because it may take several weeks to see the full effects of this medicine, it is important to continue your treatment as prescribed by your doctor. Patients and their families should watch out for new or worsening thoughts of suicide or depression. Also watch out for sudden changes in feelings such as feeling anxious, agitated, panicky, irritable, hostile, aggressive, impulsive, severely restless, overly excited and hyperactive, or not being able to sleep. If this happens, especially at the beginning of treatment or after a change in dose, call your health care professional. Dennis Bast may get drowsy or dizzy. Do not drive, use machinery, or do anything that needs mental alertness until you know how this medicine affects you. Do not stand or sit up quickly, especially if you are an older patient. This reduces the risk of dizzy or fainting spells. Alcohol may interfere with the effect of this medicine. Avoid alcoholic drinks. Your mouth may get dry. Chewing sugarless gum or sucking hard candy, and drinking plenty of water may help. Contact your doctor if the problem does not go away or is severe. What side effects may I notice from receiving this medicine? Side effects that you should report to your doctor or health care professional as soon as possible: -allergic reactions like skin rash, itching or hives, swelling of the face, lips, or tongue -anxious -black, tarry stools -changes in vision -confusion -elevated mood, decreased need for sleep, racing thoughts, impulsive behavior -eye pain -fast, irregular  heartbeat -feeling faint or lightheaded, falls -feeling agitated, angry, or irritable -hallucination, loss of contact with reality -loss of balance or coordination -loss of memory -painful or prolonged erections -restlessness, pacing, inability to keep still -seizures -stiff muscles -suicidal thoughts or other mood changes -trouble sleeping -unusual bleeding or bruising -unusually weak or tired -vomiting Side effects that usually do not require medical attention (report to your doctor or health care professional if they continue or are bothersome): -change in appetite or weight -change in sex drive or performance -diarrhea -increased sweating -indigestion, nausea -tremors This list may not describe all possible side effects. Call your doctor for medical advice about side effects. You may report side effects to FDA at 1-800-FDA-1088. Where should I keep my medicine? Keep out of the reach of children. Store at room temperature between 15 and 30 degrees C (59 and 86 degrees F). Throw away any unused medicine after the expiration date. NOTE: This sheet is a summary. It may not cover all possible information. If you have questions about this medicine, talk to your doctor, pharmacist, or health care provider.  2018 Elsevier/Gold Standard (2016-12-10 14:17:49)

## 2017-08-19 NOTE — Progress Notes (Signed)
Terrance Carlson is a 36 y.o. male who [facility] today for neuropathy. Patient has had paresthesias intermittently. His symptoms worsen and developed into motor symptoms. He is seen by neurology and ultimately admitted when he became significantly weak. 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 He has been receiving IVIG therapy which has helped significantly. He continues management with neurology.  Depression: Patient notes depression symptoms. His wife is elderly and opiate addiction currently and he has had significant health concerns as well. He is starting counseling through work which has helped. He's not sure if he should take medicines or not. He denies any SI or HI.   Past Medical History:  Diagnosis Date  . High triglycerides 04/29/2016  . History of melanoma 04/22/2016   Left abdomen status post excision in 2013.   [name]. Hyperglycemia 04/29/2016  . OSA (obstructive sleep apnea) 05/31/2017   Past Surgical History:  Procedure Laterality Date  . OLECRANON BURSECTOMY Right 2017   Social History  Substance Use Topics  . Smoking status: Current Every Day Smoker  . Smokeless tobacco: Never Used  . Alcohol use Not on file   family history is not on file.  ROS as above:  Medications: Current Outpatient Prescriptions  Medication Sig Dispense Refill  . gabapentin (NEURONTIN) 300 MG capsule Take 600 mg by mouth 3 (three) times daily.    . vitamin B-12 (CYANOCOBALAMIN) 500 MCG tablet Take 1 tablet by mouth daily.    . AMBULATORY NON FORMULARY MEDICATION Continuous positive airway pressure (CPAP) machine auto-titrate from 4-20 cm of H2O pressure, with all supplemental supplies as needed. 1 each 0  . Cholecalciferol (VITAMIN D3) 5000 UNIT/ML LIQD Take by mouth.    . Omega-3 Fatty Acids (FISH OIL) 1000 MG CAPS Take 1,000 mg by  mouth daily.    . sertraline (ZOLOFT) 25 MG tablet Take 1 pill po daily for 1 week then increase to 2 pills daily. Recheck in 2 weeks. 30 tablet 2  . varenicline (CHANTIX) 1 MG tablet Take 1 tablet (1 mg total) by mouth 2 (two) times daily. 60 tablet 3   No current facility-administered medications for this visit.    Allergies  Allergen Reactions  . Influenza Vaccines Other (See Comments)    Margarito CourserGianne Barre Syndrome    Health Maintenance Health Maintenance  Topic Date Due  . TETANUS/TDAP  08/19/2018 (Originally [birth year]27/1999)  . HIV Screening  Completed     Exam:  BP 124/80   Pulse (!) 102   Wt 281 lb (127.5 kg)   SpO2 94%   BMI 38.11 kg/m  Gen: Well NAD HEENT: EOMI,  MMM Lungs: Normal work of breathing. CTABL Heart: RRR no MRG Abd: NABS, Soft. Nondistended, Nontender Exts: Brisk capillary refill, warm and well perfused.  Psych alert and oriented normal speech thought process and affect.  Depression screen PHQ 2/9 08/19/2017  Decreased Interest 2  Down, Depressed, Hopeless 2  PHQ - 2 Score 4  Altered sleeping 0  Tired, decreased energy 2  Change in appetite 2  Feeling bad or failure about yourself  3  Trouble concentrating 2  Moving slowly or fidgety/restless 1  Suicidal thoughts 0  PHQ-9 Score 14      No results found for this or any previous visit (from the past 72 hour(s)). No results found.    Assessment and Plan: 36 y.o. male with  Neuropathy: Per neurology. Plan for  oral comanagement and watchful waiting. Agree with plan. Avoid flu vaccines.  Depression: New issue today. Continue counseling and therapy. Start Zoloft with plan to taper from 25 mg to 100 mg. Recheck in 2 weeks.   No orders of the defined types were placed in this encounter.  Meds ordered this encounter  Medications  . vitamin B-12 (CYANOCOBALAMIN) 500 MCG tablet    Sig: Take 1 tablet by mouth daily.  [name] gabapentin (NEURONTIN) 300 MG capsule    Sig: Take 600 mg by mouth 3 (three) times  daily.  . sertraline (ZOLOFT) 25 MG tablet    Sig: Take 1 pill po daily for 1 week then increase to 2 pills daily. Recheck in 2 weeks.    Dispense:  30 tablet    Refill:  2   I spent 40 minutes with this patient, greater than 50% was face-to-face time counseling regarding differential diagnosis treatment plan options of medication side effects..   Discussed warning signs or symptoms. Please see discharge instructions. Patient expresses understanding.

## 2017-08-31 ENCOUNTER — Ambulatory Visit (INDEPENDENT_AMBULATORY_CARE_PROVIDER_SITE_OTHER): Payer: 59 | Admitting: Family Medicine

## 2017-08-31 ENCOUNTER — Encounter: Payer: Self-pay | Admitting: Family Medicine

## 2017-08-31 VITALS — BP 115/77 | HR 71 | Wt 281.0 lb

## 2017-08-31 DIAGNOSIS — F321 Major depressive disorder, single episode, moderate: Secondary | ICD-10-CM | POA: Diagnosis not present

## 2017-08-31 MED ORDER — SERTRALINE HCL 100 MG PO TABS: 100.0000 mg | ORAL_TABLET | Freq: Every day | ORAL | 3 refills | Status: DC

## 2017-08-31 NOTE — Patient Instructions (Signed)
Thank you for coming in today. Increase to  zoloft in about 1 week.  Take it in the evening before bed.  Recheck in 2 weeks.   Feeling like a Zombie is not OK.

## 2017-08-31 NOTE — Progress Notes (Signed)
Terrance Carlson is a 36 y.o. male who presents to [facility]: Primary Care Sports Medicine today for follow-up anxiety/depression. Patient was seen 2 weeks ago for anxiety/depression. He's been continuing counseling which has been helpful. Additionally he currently takes 50 mg of Zoloft daily. He notes his medication is certainly helping his symptoms. He notes less anxiety symptoms. He notes medication makes him feel tired he typically takes it in the evening.   Past Medical History:  Diagnosis Date  . 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 High triglycerides 04/29/2016  . History of melanoma 04/22/2016   Left abdomen status post excision in 2013.   [name] Hyperglycemia 04/29/2016  . OSA (obstructive sleep apnea) 05/31/2017   Past Surgical History:  Procedure Laterality Date  . OLECRANON BURSECTOMY Right 2017   Social History  Substance Use Topics  . Smoking status: Current Every Day Smoker  . Smokeless tobacco: Never Used  . Alcohol use Not on file   family history is not on file.  ROS as above:  Medications: Current Outpatient Prescriptions  Medication Sig Dispense Refill  . AMBULATORY NON FORMULARY MEDICATION Continuous positive airway pressure (CPAP) machine auto-titrate from 4-20 cm of H2O pressure, with all supplemental supplies as needed. 1 each 0  . Cholecalciferol (VITAMIN D3) 5000 UNIT/ML LIQD Take by mouth.    . gabapentin (NEURONTIN) 300 MG capsule Take 600 mg by mouth 3 (three) times daily.    . Omega-3 Fatty Acids (FISH OIL) 1000 MG CAPS Take 1,000 mg by mouth daily.    . varenicline (CHANTIX) 1 MG tablet Take 1 tablet (1 mg total) by mouth 2 (two) times daily. 60 tablet 3  . vitamin B-12 (CYANOCOBALAMIN) 500 MCG tablet Take 1 tablet by mouth daily.    . sertraline (ZOLOFT) 100 MG tablet Take 1 tablet (100 mg total) by mouth daily. 30 tablet 3   No current  facility-administered medications for this visit.    Allergies  Allergen Reactions  . Influenza Vaccines Other (See Comments)    Margarito Courser Syndrome    Health Maintenance Health Maintenance  Topic Date Due  . TETANUS/TDAP  08/19/2018 (Originally 03/27/1999)  . HIV Screening  Completed     Exam:  BP 115/77   Pulse 71   Wt 281 lb (127.5 kg)   BMI 38.11 kg/m  Gen: Well NAD Psych alert and oriented normal speech thought process and affect. No SI or HI expressed.  Depression screen [facility] 2/9 08/31/2017 08/19/2017  Decreased Interest 1 2  Down, Depressed, Hopeless 1 2  PHQ - 2 Score 2 4  Altered sleeping 1 0  Tired, decreased energy 1 2  Change in appetite 2 2  Feeling bad or failure about yourself  2 3  Trouble concentrating 2 2  Moving slowly or fidgety/restless 1 1  Suicidal thoughts 0 0  PHQ-9 Score 11 14   GAD 7 : Generalized Anxiety Score 08/31/2017  Nervous, Anxious, on Edge 2  Control/stop worrying 1  Worry too much - different things 1  Trouble relaxing 2  Restless 2  Easily annoyed or irritable 1  Afraid - awful might happen 1  Total GAD 7 Score 10       No results found for this or any previous visit (from the past 72 hour(s)). No results found.    Assessment and Plan: 36 y.o. male with depression and anxiety. Doing well. Plan to titrate Zoloft to 100 mg at  night. Recheck in 2 weeks. Continue counseling.   No orders of the defined types were placed in this encounter.  Meds ordered this encounter  Medications  . sertraline (ZOLOFT) 100 MG tablet    Sig: Take 1 tablet (100 mg total) by mouth daily.    Dispense:  30 tablet    Refill:  3     Discussed warning signs or symptoms. Please see discharge instructions. Patient expresses understanding.

## 2017-09-14 ENCOUNTER — Ambulatory Visit (INDEPENDENT_AMBULATORY_CARE_PROVIDER_SITE_OTHER): Payer: 59 | Admitting: Family Medicine

## 2017-09-14 ENCOUNTER — Encounter: Payer: Self-pay | Admitting: Family Medicine

## 2017-09-14 VITALS — BP 115/70 | HR 73

## 2017-09-14 DIAGNOSIS — F321 Major depressive disorder, single episode, moderate: Secondary | ICD-10-CM

## 2017-09-14 MED ORDER — SERTRALINE HCL 100 MG PO TABS: 100.0000 mg | ORAL_TABLET | Freq: Every day | ORAL | 3 refills | Status: DC

## 2017-09-14 NOTE — Progress Notes (Signed)
Terrance Carlson is a 36 y.o. male who presents to [name] today for follow-up depression. Sire is doing well with depression. He currently is tolerating 100 mg of Zoloft daily. He tolerates medication well and has no issues with it. He would like to continue the medication.   Past Medical History:  Diagnosis Date  . 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 High triglycerides 04/29/2016  . History of melanoma 04/22/2016   Left abdomen status post excision in 2013.   [name] Hyperglycemia 04/29/2016  . OSA (obstructive sleep apnea) 05/31/2017   Past Surgical History:  Procedure Laterality Date  . OLECRANON BURSECTOMY Right 2017   Social History  Substance Use Topics  . Smoking status: Current Every Day Smoker  . Smokeless tobacco: Never Used  . Alcohol use Not on file   family history is not on file.  ROS as above:  Medications: Current Outpatient Prescriptions  Medication Sig Dispense Refill  . AMBULATORY NON FORMULARY MEDICATION Continuous positive airway pressure (CPAP) machine auto-titrate from 4-20 cm of H2O pressure, with all supplemental supplies as needed. 1 each 0  . Cholecalciferol (VITAMIN D3) 5000 UNIT/ML LIQD Take by mouth.    . gabapentin (NEURONTIN) 300 MG capsule Take 600 mg by mouth 3 (three) times daily.    . Omega-3 Fatty Acids (FISH OIL) 1000 MG CAPS Take 1,000 mg by mouth daily.    . sertraline (ZOLOFT) 100 MG tablet Take 1 tablet (100 mg total) by mouth daily. 90 tablet 3  . varenicline (CHANTIX) 1 MG tablet Take 1 tablet (1 mg total) by mouth 2 (two) times daily. 60 tablet 3  . vitamin B-12 (CYANOCOBALAMIN) 500 MCG tablet Take 1 tablet by mouth daily.     No current facility-administered medications for this visit.    Allergies  Allergen Reactions  . Influenza Vaccines Other (See Comments)    Margarito Courser Syndrome     Health Maintenance Health Maintenance  Topic Date Due  . TETANUS/TDAP  08/19/2018 (Originally 03/27/1999)  . HIV Screening  Completed     Exam:  BP 115/70   Pulse 73  Gen: Well NAD Psych alert and oriented normal speech thought process and affect. No SI or HI expressed. Depression screen [facility] 2/9 09/14/2017 08/31/2017 08/19/2017  Decreased Interest 0 1 2  Down, Depressed, Hopeless PHQ - 2 Score Altered sleeping 0 1 0  Tired, decreased energy 0 1 2  Change in appetite 0 2 2  Feeling bad or failure about yourself  Trouble concentrating 0 2 2  Moving slowly or fidgety/restless 0 1 1  Suicidal thoughts 0 0 0  PHQ-9 Score No results found for this or any previous visit (from the past 72 hour(s)). No results found.    Assessment and Plan: 36 y.o. male with  Depression significantly improved continue Zoloft recheck in 6 months.   No orders of the defined types were placed in this encounter.  Meds ordered this encounter  Medications  . sertraline (ZOLOFT) 100 MG tablet    Sig: Take 1 tablet (100 mg total) by mouth daily.    Dispense:  90 tablet    Refill:  3     Discussed warning signs or symptoms. Please see discharge instructions. Patient expresses understanding.  I spent 15 minutes with this patient, greater than 50%  was face-to-face time counseling regarding treatment plan.

## 2017-09-14 NOTE — Patient Instructions (Signed)
Thank you for coming in today. Recheck in 6 months or sooner if needed.  Continue current medications.

## 2017-12-19 ENCOUNTER — Other Ambulatory Visit: Payer: Self-pay

## 2017-12-19 MED ORDER — VARENICLINE TARTRATE 1 MG PO TABS: 1.0000 mg | ORAL_TABLET | Freq: Two times a day (BID) | ORAL | 3 refills | Status: DC

## 2018-02-09 ENCOUNTER — Encounter: Payer: Self-pay | Admitting: Family Medicine

## 2018-02-09 ENCOUNTER — Ambulatory Visit (INDEPENDENT_AMBULATORY_CARE_PROVIDER_SITE_OTHER): Payer: 59 | Admitting: Family Medicine

## 2018-02-09 VITALS — BP 143/78 | HR 98 | Temp 98.2°F | Wt 297.0 lb

## 2018-02-09 DIAGNOSIS — G6181 Chronic inflammatory demyelinating polyneuritis: Secondary | ICD-10-CM | POA: Diagnosis not present

## 2018-02-09 DIAGNOSIS — F321 Major depressive disorder, single episode, moderate: Secondary | ICD-10-CM

## 2018-02-09 DIAGNOSIS — H00014 Hordeolum externum left upper eyelid: Secondary | ICD-10-CM | POA: Diagnosis not present

## 2018-02-09 DIAGNOSIS — M7021 Olecranon bursitis, right elbow: Secondary | ICD-10-CM | POA: Diagnosis not present

## 2018-02-09 MED ORDER — DOXYCYCLINE HYCLATE 100 MG PO TABS: 100.0000 mg | ORAL_TABLET | Freq: Two times a day (BID) | ORAL | 0 refills | Status: DC

## 2018-02-09 MED ORDER — VARENICLINE TARTRATE 0.5 MG X 11 & 1 MG X 42 PO MISC: ORAL | 0 refills | Status: DC

## 2018-02-09 MED ORDER — SERTRALINE HCL 100 MG PO TABS: 100.0000 mg | ORAL_TABLET | Freq: Every day | ORAL | 3 refills | Status: DC

## 2018-02-09 NOTE — Patient Instructions (Signed)
Thank you for coming in today. Restart Chantix Continue zoloft.  Use doxycycline for syte.  Use warm compress.  If not better follow up with ophthalmology.  Dr Rachell CiproHagaaman is local.  Continue compression of the elbow.  Alycia RossettiRyan should contact you.   Recheck as needed.

## 2018-02-09 NOTE — Progress Notes (Signed)
Terrance Carlson is a 37 y.o. male who presents to [name]: Primary Care Sports Medicine today for mood, stye left eye, right elbow olecranon bursitis, CIDP, smoking.   Stye left eye: Terrance Carlson notes a 2-week history of painful bump in the left upper eyelid.  This is consistent with a stye.  He has had these in the past and one time had to have a chalazion removed 5 years ago.  He has been trying occasional warm compress and some over-the-counter stye ointment which have not helped much.  He denies any blurry vision fevers chills nausea vomiting or diarrhea.  Mood: Terrance Carlson takes Zoloft for depression and notes that it works quite well.  He tolerates the medication well and needs a refill.  Right elbow swelling: Terrance Carlson has a history of recurrent right elbow olecranon bursitis.  He had a bursectomy and notes of the last month the swelling has returned.  It is not painful but it is obnoxious.  No redness to the skin or discharge present.  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  She responded well to IVIG.  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  He notes that is well controlled with intermittent IVIG injections and is feeling great.  He notes that he can never have another flu shot.  Smoking: Terrance Carlson continues to smoke and is interested in Chantix.  He is noted in the past and would like to try to again.  He tolerated it well.   Past Medical History:  Diagnosis Date  . 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 High triglycerides 04/29/2016  . History of melanoma 04/22/2016   Left abdomen status post excision in 2013.   [name] Hyperglycemia 04/29/2016  . OSA (obstructive sleep apnea) 05/31/2017   Past Surgical History:  Procedure Laterality Date  . OLECRANON  BURSECTOMY Right 2017   Social History   Tobacco Use  . Smoking status: Current Every Day Smoker  . Smokeless tobacco: Never Used  Substance Use Topics  . Alcohol use: Not on file   family history is not on file.  ROS as above:  Medications: Current Outpatient Medications  Medication Sig Dispense Refill  . AMBULATORY NON FORMULARY MEDICATION Continuous positive airway pressure (CPAP) machine auto-titrate from 4-20 cm of H2O pressure, with all supplemental supplies as needed. 1 each 0  . Cholecalciferol (VITAMIN D3) 5000 UNIT/ML LIQD Take by mouth.    . gabapentin (NEURONTIN) 300 MG capsule Take 600 mg by mouth 3 (three) times daily.    . Omega-3 Fatty Acids (FISH OIL) 1000 MG CAPS Take 1,000 mg by mouth daily.    . sertraline (ZOLOFT) 100 MG tablet Take 1 tablet (100 mg total) by mouth daily. 90 tablet 3  . vitamin B-12 (CYANOCOBALAMIN) 500 MCG tablet Take 1 tablet by mouth daily.    [name] doxycycline (VIBRA-TABS) 100 MG tablet Take 1 tablet (100 mg total) by mouth 2 (two) times daily. 14 tablet 0  . varenicline (CHANTIX STARTING MONTH PAK) 0.5 MG X 11 & 1 MG X 42 tablet Take one 0.5mg  tablet by mouth once daily for 3 days, then increase to one 0.5mg  tablet twice daily for 3 days, then increase to one 1mg  tablet twice daily. 53 tablet 0   No current facility-administered medications for this visit.    Allergies  Allergen Reactions  . Influenza Vaccines Other (  See Comments)    Margarito CourserGianne Barre Syndrome    Health Maintenance Health Maintenance  Topic Date Due  . TETANUS/TDAP  08/19/2018 (Originally 03/27/1999)  . HIV Screening  Completed     Exam:  BP (!) 143/78   Pulse 98   Temp 98.2 F (36.8 C) (Oral)   Wt 297 lb (134.7 kg)   BMI 40.28 kg/m  Gen: Well NAD HEENT: EOMI,  MMM large swollen cyst at the left eyelid consistent with hordeolum.  No expressible pus.  No conjunctival injection. Lungs: Normal work of breathing. CTABL Heart: RRR no MRG Abd: NABS, Soft. Nondistended,  Nontender Exts: Brisk capillary refill, warm and well perfused.  Right elbow fluctuant swelling at the olecranon without tenderness erythema or induration. Psych: Alert and oriented normal speech thought process and affect.  Aspiration and injection of right olecranon bursitis: Consent obtained and timeout performed. Skin cleaned with alcohol and 1 mL of lidocaine injected at the central of the fluctuance. The skin was again cleaned with chlorhexidine and an 18-gauge needle was used to access the bursa sac. 20 mL of pinkish clear fluid was removed. The syringe was exchanged and 1 mL of Marcaine and 40 mg of Kenalog were injected. A Band-Aid and an Ace wrap was applied.  Patient tolerated the procedure well.   No results found for this or any previous visit (from the past 72 hour(s)). No results found.    Assessment and Plan: 37 y.o. male with  Hordeolum approaching to lesion.  Work on warm compress and oral doxycycline.  Follow-up with ophthalmology if not better at that point and will likely need incision and drainage.  Right elbow olecranon swelling.  Status post aspiration and injection today.  Emphasized compression and padding.  Mood: Doing well continue Zoloft.  Neuro: CIPD managed with neurology will continue to follow. Medical records reviewed.   Smoking: Prescribe Chantix  No orders of the defined types were placed in this encounter.  Meds ordered this encounter  Medications  . sertraline (ZOLOFT) 100 MG tablet    Sig: Take 1 tablet (100 mg total) by mouth daily.    Dispense:  90 tablet    Refill:  3  . doxycycline (VIBRA-TABS) 100 MG tablet    Sig: Take 1 tablet (100 mg total) by mouth 2 (two) times daily.    Dispense:  14 tablet    Refill:  0  . varenicline (CHANTIX STARTING MONTH PAK) 0.5 MG X 11 & 1 MG X 42 tablet    Sig: Take one 0.5mg  tablet by mouth once daily for 3 days, then increase to one 0.5mg  tablet twice daily for 3 days, then increase to one 1mg   tablet twice daily.    Dispense:  53 tablet    Refill:  0     Discussed warning signs or symptoms. Please see discharge instructions. Patient expresses understanding.

## 2018-03-08 ENCOUNTER — Ambulatory Visit (INDEPENDENT_AMBULATORY_CARE_PROVIDER_SITE_OTHER): Payer: 59 | Admitting: Family Medicine

## 2018-03-08 ENCOUNTER — Encounter: Payer: Self-pay | Admitting: Family Medicine

## 2018-03-08 VITALS — BP 137/86 | HR 80 | Ht 72.0 in | Wt 288.0 lb

## 2018-03-08 DIAGNOSIS — Z85820 Personal history of malignant melanoma of skin: Secondary | ICD-10-CM

## 2018-03-08 DIAGNOSIS — G6181 Chronic inflammatory demyelinating polyneuritis: Secondary | ICD-10-CM

## 2018-03-08 DIAGNOSIS — M722 Plantar fascial fibromatosis: Secondary | ICD-10-CM

## 2018-03-08 NOTE — Patient Instructions (Signed)
Thank you for coming in today. 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  If it hurts we will inject it.  If is grows rapidly we will get a MRI.   For weight try to shoot for <2000 calories a day.

## 2018-03-08 NOTE — Progress Notes (Signed)
Terrance Carlson is a 37 y.o. male [facility] today for plantar mass right foot.  [place]illes has a history of a small bump on the plantar aspect of his right foot present for years.  However over the last few months it has been enlarging.  He notes that this is not painful or tender however.  He notes that his medical history is significant for several unusual problems including chronic inflammatory demyelinating polyneuropathy which is currently well managed with intermittent IVIG infusions.  Additionally he has a history of melanoma excised from the left abdomen in 2013.  He is obviously concerned given his health history that this may represent some dangerous process.  He denies any fevers or chills night sweats or weight loss.  Of note Terrance EndoKelly is also trying to lose weight.  His goal weight is 250 pounds.  He is starting a lower carbohydrate diet with calorie restriction.  Additionally since he is feeling better from his neurological standpoint he is starting to exercise a bit more as well.  He is currently calorie logging   Past Medical History:  Diagnosis Date  . 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 High triglycerides 04/29/2016  . History of melanoma 04/22/2016   Left abdomen status post excision in 2013.   [name]. Hyperglycemia 04/29/2016  . OSA (obstructive sleep apnea) 05/31/2017   Past Surgical History:  Procedure Laterality Date  . OLECRANON BURSECTOMY Right 2017   Social History   Tobacco Use  . Smoking status: Current Every Day Smoker  . Smokeless tobacco: Never Used  Substance Use Topics  . Alcohol use: Not on file   family history is not on file.  ROS as above:  Medications: Current Outpatient Medications  Medication Sig Dispense Refill  . AMBULATORY NON FORMULARY MEDICATION Continuous positive airway pressure  (CPAP) machine auto-titrate from 4-20 cm of H2O pressure, with all supplemental supplies as needed. 1 each 0  . Cholecalciferol (VITAMIN D3) 5000 UNIT/ML LIQD Take by mouth.    . gabapentin (NEURONTIN) 300 MG capsule Take 600 mg by mouth 3 (three) times daily.    . Omega-3 Fatty Acids (FISH OIL) 1000 MG CAPS Take 1,000 mg by mouth daily.    . sertraline (ZOLOFT) 100 MG tablet Take 1 tablet (100 mg total) by mouth daily. 90 tablet 3  . varenicline (CHANTIX STARTING MONTH PAK) 0.5 MG X 11 & 1 MG X 42 tablet Take one 0.5mg  tablet by mouth once daily for 3 days, then increase to one 0.5mg  tablet twice daily for 3 days, then increase to one 1mg  tablet twice daily. 53 tablet 0  . vitamin B-12 (CYANOCOBALAMIN) 500 MCG tablet Take 1 tablet by mouth daily.     No current facility-administered medications for this visit.    Allergies  Allergen Reactions  . Influenza Vaccines Other (See Comments)    Terrance CourserGianne Barre Carlson    Health Maintenance Health Maintenance  Topic Date Due  . TETANUS/TDAP  08/19/2018 (Originally 03/27/1999)  . HIV Screening  Completed     Exam:  BP 137/86   Pulse 80   Ht 6' (1.829 m)   Wt 288 lb (130.6 kg)   BMI 39.06 kg/m   Wt Readings from Last 20 Encounters:  03/08/18 288 lb (130.6 kg)  02/09/18 297 lb (134.7 kg)  08/31/17 281 lb (127.5 kg)  08/19/17 281 lb (127.5 kg)  06/09/17 279 lb (126.6 kg)  05/31/17 278 lb (126.1 kg)  01/11/17 288 lb (130.6 kg)  12/16/16 279 lb 14.4 oz (127 kg)  12/08/16 284 lb (128.8 kg)  11/09/16 278 lb (126.1 kg)  07/29/16 272 lb (123.4 kg)  04/22/16 282 lb (127.9 kg)    Gen: Well NAD HEENT: EOMI,  MMM Lungs: Normal work of breathing. CTABL Heart: RRR no MRG Abd: NABS, Soft. Nondistended, Nontender Exts: Brisk capillary refill, warm and well perfused.  Right foot: Small superficial nodule at the middle third of the plantar foot along the medial aspect along the course of the plantar fascia.  Nontender non-mobile.  Approximately  1-1/2 x 1 cm.  Limited musculoskeletal ultrasound of the right plantar foot: The nodule was clearly identified superficially along the course of the plantar fascia measuring 1.6 x 0.6 x 0.6.  The nodule has a heterogeneous texture with hypoechoic and hyperechoic changes. There is no blood flow on Doppler exam within the mass. Bony structures are normal otherwise. 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   No results found for this or any previous visit (from the past 72 hour(Terrance)). No results found.    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  I am not sure exactly why it has been worsening recently.  I suspect that this is likely due to altered gait due to his neurological weakness that is currently improving with IVIG.  Additionally his increased exercise recently may be a causal agent here.  Regardless he is not painful in this area therefore I do not think specific treatment is needed at this time.  The differential does include some scary diagnoses including malignancy etc. that we should be wary of.  After discussion with Terrance Carlson plan for watchful waiting and if worsening or growing proceed with either injection or MRI to fully evaluate this mass.   Obesity: I think a goal of 250 pounds is fantastic in the medium to short-term.  He should lose 1-2 pounds a week with a 2000-calorie diet.  We discussed reduced carbs which is reasonable.  Recheck in a few months or sooner if needed.   No orders of the defined types were placed in this encounter.  No orders of the defined types were placed in this encounter.    Discussed warning signs or symptoms. Please see discharge instructions. Patient expresses understanding.  I spent 25 minutes with this patient, greater than 50% was face-to-face time counseling regarding ddx and treatment option. We looked up this diagnosis in my [place] textbook  ultrasound of the musculoskeletal system Terrance Carlson and Terrance Carlson together.  We talked about treatment options.  Recently we discussed diet and exercise program and weight loss goals.

## 2018-03-16 ENCOUNTER — Ambulatory Visit: Payer: 59 | Admitting: Family Medicine

## 2018-03-30 ENCOUNTER — Telehealth: Payer: Self-pay | Admitting: Family Medicine

## 2018-03-30 MED ORDER — DOXYCYCLINE HYCLATE 100 MG PO TABS: 100.0000 mg | ORAL_TABLET | Freq: Two times a day (BID) | ORAL | 0 refills | Status: DC

## 2018-03-30 NOTE — Telephone Encounter (Signed)
Elbow skin possible infection.  Doxy sent.  Follow up if not better.

## 2018-04-03 ENCOUNTER — Encounter: Payer: Self-pay | Admitting: Family Medicine

## 2018-04-04 ENCOUNTER — Encounter: Payer: Self-pay | Admitting: Family Medicine

## 2018-04-06 ENCOUNTER — Ambulatory Visit (INDEPENDENT_AMBULATORY_CARE_PROVIDER_SITE_OTHER): Payer: 59 | Admitting: Family Medicine

## 2018-04-06 ENCOUNTER — Encounter: Payer: Self-pay | Admitting: Family Medicine

## 2018-04-06 ENCOUNTER — Telehealth: Payer: Self-pay | Admitting: Family Medicine

## 2018-04-06 VITALS — BP 142/96 | HR 82 | Temp 97.8°F | Ht 72.0 in | Wt 280.0 lb

## 2018-04-06 DIAGNOSIS — L03113 Cellulitis of right upper limb: Secondary | ICD-10-CM

## 2018-04-06 MED ORDER — DOXYCYCLINE HYCLATE 100 MG PO TABS: 100.0000 mg | ORAL_TABLET | Freq: Two times a day (BID) | ORAL | 0 refills | Status: DC

## 2018-04-06 MED ORDER — CEFDINIR 300 MG PO CAPS: 300.0000 mg | ORAL_CAPSULE | Freq: Two times a day (BID) | ORAL | 0 refills | Status: DC

## 2018-04-06 NOTE — Telephone Encounter (Signed)
Pt added on to schedule for today.

## 2018-04-06 NOTE — Telephone Encounter (Signed)
Ms. Terrance Carlson called. 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 He was seen recently and was told if he wasn't any better to call and you would work him in.

## 2018-04-06 NOTE — Patient Instructions (Signed)
Thank you for coming in today. Continue doxy.  Add cefdinir.  Recheck in 1 week.    Cellulitis, Adult Cellulitis is a skin infection. The infected area is usually red and tender. This condition occurs most often in the arms and lower legs. The infection can travel to the muscles, blood, and underlying tissue and become serious. It is very important to get treated for this condition. What are the causes? Cellulitis is caused by bacteria. The bacteria enter through a break in the skin, such as a cut, burn, insect bite, open sore, or crack. What increases the risk? This condition is more likely to occur in people who:  Have a weak defense system (immune system).  Have open wounds on the skin such as cuts, burns, bites, and scrapes. Bacteria can enter the body through these open wounds.  Are older.  Have diabetes.  Have a type of long-lasting (chronic) liver disease (cirrhosis) or kidney disease.  Use IV drugs.  What are the signs or symptoms? Symptoms of this condition include:  Redness, streaking, or spotting on the skin.  Swollen area of the skin.  Tenderness or pain when an area of the skin is touched.  Warm skin.  Fever.  Chills.  Blisters.  How is this diagnosed? This condition is diagnosed based on a medical history and physical exam. You may also have tests, including:  Blood tests.  Lab tests.  Imaging tests.  How is this treated? Treatment for this condition may include:  Medicines, such as antibiotic medicines or antihistamines.  Supportive care, such as rest and application of cold or warm cloths (cold or warm compresses) to the skin.  Hospital care, if the condition is severe.  The infection usually gets better within 1-2 days of treatment. Follow these instructions at home:  Take over-the-counter and prescription medicines only as told by your health care provider.  If you were prescribed an antibiotic medicine, take it as told by your health  care provider. Do not stop taking the antibiotic even if you start to feel better.  Drink enough fluid to keep your urine clear or pale yellow.  Do not touch or rub the infected area.  Raise (elevate) the infected area above the level of your heart while you are sitting or lying down.  Apply warm or cold compresses to the affected area as told by your health care provider.  Keep all follow-up visits as told by your health care provider. This is important. These visits let your health care provider make sure a more serious infection is not developing. Contact a health care provider if:  You have a fever.  Your symptoms do not improve within 1-2 days of starting treatment.  Your bone or joint underneath the infected area becomes painful after the skin has healed.  Your infection returns in the same area or another area.  You notice a swollen bump in the infected area.  You develop new symptoms.  You have a general ill feeling (malaise) with muscle aches and pains. Get help right away if:  Your symptoms get worse.  You feel very sleepy.  You develop vomiting or diarrhea that persists.  You notice red streaks coming from the infected area.  Your red area gets larger or turns dark in color. This information is not intended to replace advice given to you by your health care provider. Make sure you discuss any questions you have with your health care provider. Document Released: 09/15/2005 Document Revised: 04/15/2016 Document Reviewed:  10/15/2015 Elsevier Interactive Patient Education  [employer].

## 2018-04-06 NOTE — Progress Notes (Signed)
Terrance Carlson is a 37 y.o. male who presents to [name]: Primary Care Sports Medicine today for follow-up elbow cellulitis.  [name] has a history of recurrent bursitis.  This has been complicated in the past by infection.  He had excision months ago. He notes that he did have one episode of cellulitis treated with oral ABX by his orthopedist.  Last week he notes red crusty painful skin overlying the right olecranon. I empirically treated with oral doxycyline. He notes that the skin is not much better. He does have some systemic symptoms including body aches. He denies any severe fever, or chills or worsening pain or swelling. He continues the doxycycline.  His medical history is complicated by CIDP treated with IVIG. He is due to repeat infusion in 2 weeks. He is not sure if his current mild systemic symptoms are due to IVIG wearing off or his infection.  Past Medical History:  Diagnosis Date  . 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 High triglycerides 04/29/2016  . History of melanoma 04/22/2016   Left abdomen status post excision in 2013.   [name] Hyperglycemia 04/29/2016  . OSA (obstructive sleep apnea) 05/31/2017   Past Surgical History:  Procedure Laterality Date  . OLECRANON BURSECTOMY Right 2017   Social History   Tobacco Use  . Smoking status: Current Every Day Smoker  . Smokeless tobacco: Never Used  Substance Use Topics  . Alcohol use: Not on file   family history is not on file.  ROS as above:  Medications: Current Outpatient Medications  Medication Sig Dispense Refill  . AMBULATORY NON FORMULARY MEDICATION Continuous positive airway pressure (CPAP) machine auto-titrate from 4-20 cm of H2O pressure, with all supplemental supplies as needed. 1 each 0  . cefdinir (OMNICEF) 300 MG capsule Take 1 capsule (300 mg total) by mouth 2 (two) times daily. 14  capsule 0  . Cholecalciferol (VITAMIN D3) 5000 UNIT/ML LIQD Take by mouth.    . doxycycline (VIBRA-TABS) 100 MG tablet Take 1 tablet (100 mg total) by mouth 2 (two) times daily. 14 tablet 0  . gabapentin (NEURONTIN) 300 MG capsule Take 600 mg by mouth 3 (three) times daily.    . Omega-3 Fatty Acids (FISH OIL) 1000 MG CAPS Take 1,000 mg by mouth daily.    . sertraline (ZOLOFT) 100 MG tablet Take 1 tablet (100 mg total) by mouth daily. 90 tablet 3  . varenicline (CHANTIX STARTING MONTH PAK) 0.5 MG X 11 & 1 MG X 42 tablet Take one 0.5mg  tablet by mouth once daily for 3 days, then increase to one 0.5mg  tablet twice daily for 3 days, then increase to one 1mg  tablet twice daily. 53 tablet 0  . vitamin B-12 (CYANOCOBALAMIN) 500 MCG tablet Take 1 tablet by mouth daily.     No current facility-administered medications for this visit.    Allergies  Allergen Reactions  . Influenza Vaccines Other (See Comments)    Margarito Courser Syndrome    Health Maintenance Health Maintenance  Topic Date Due  . TETANUS/TDAP  08/19/2018 (Originally 03/27/1999)  . HIV Screening  Completed     Exam:  BP (!) 142/96   Pulse 82   Temp 97.8 F (36.6 C) (Oral)   Ht 6' (1.829 m)   Wt 280 lb (127 kg)   BMI 37.97 kg/m  Gen: Well NAD non-toxic appearing HEENT: EOMI,  MMM Lungs: Normal work of breathing. CTABL Heart: RRR no MRG Abd: NABS,  Soft. Nondistended, Nontender Exts: Brisk capillary refill, warm and well perfused.  Right Arm: Crusted red skin overlying right olecranon. No fluctuance or bursitis present. No induration. No spreading erythremia. Mild TTP. No expressible pus.    No results found for this or any previous visit (from the past 72 hour(s)). No results found.    Assessment and Plan: 37 y.o. male with Right elbow cellulitis not improving with doxycyline. Doubtful for olecranon bursitis as no effusion or fluid collection present.  Doxycycline may not be effective for strep species. Will double  cover with doxy and omnicef. Omnicef chosen for good strep coverage as well as convenient dosing.   Recheck in 1 week. Warning signs reviewed.     No orders of the defined types were placed in this encounter.  Meds ordered this encounter  Medications  . doxycycline (VIBRA-TABS) 100 MG tablet    Sig: Take 1 tablet (100 mg total) by mouth 2 (two) times daily.    Dispense:  14 tablet    Refill:  0  . cefdinir (OMNICEF) 300 MG capsule    Sig: Take 1 capsule (300 mg total) by mouth 2 (two) times daily.    Dispense:  14 capsule    Refill:  0     Discussed warning signs or symptoms. Please see discharge instructions. Patient expresses understanding.

## 2018-04-06 NOTE — Telephone Encounter (Signed)
Wife's phone number is [phone]

## 2018-04-13 ENCOUNTER — Ambulatory Visit (INDEPENDENT_AMBULATORY_CARE_PROVIDER_SITE_OTHER): Payer: 59 | Admitting: Family Medicine

## 2018-04-13 ENCOUNTER — Encounter: Payer: Self-pay | Admitting: Family Medicine

## 2018-04-13 VITALS — BP 117/72 | HR 87 | Ht 72.01 in | Wt 282.0 lb

## 2018-04-13 DIAGNOSIS — L03113 Cellulitis of right upper limb: Secondary | ICD-10-CM

## 2018-04-13 MED ORDER — DOXYCYCLINE HYCLATE 100 MG PO TABS: 100.0000 mg | ORAL_TABLET | Freq: Two times a day (BID) | ORAL | 0 refills | Status: DC

## 2018-04-13 MED ORDER — CEFDINIR 300 MG PO CAPS: 300.0000 mg | ORAL_CAPSULE | Freq: Two times a day (BID) | ORAL | 0 refills | Status: DC

## 2018-04-13 NOTE — Progress Notes (Signed)
Terrance StandardKelly Carlson is a 37 y.o. male who [facility] today for right elbow cellulitis.  [name] was seen last week for cellulitis of the right extensor elbow that was failing to improve with doxycycline. He was also given omnicef and he notes that he is feeling a lot better. The pain and swelling have completely resolved and the redness has mostly improved. No fever, chills, NVD or body aches.    Past Medical History:  Diagnosis Date  . 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 High triglycerides 04/29/2016  . History of melanoma 04/22/2016   Left abdomen status post excision in 2013.   [name]. Hyperglycemia 04/29/2016  . OSA (obstructive sleep apnea) 05/31/2017   Past Surgical History:  Procedure Laterality Date  . OLECRANON BURSECTOMY Right 2017   Social History   Tobacco Use  . Smoking status: Current Every Day Smoker  . Smokeless tobacco: Never Used  Substance Use Topics  . Alcohol use: Not on file   family history is not on file.  ROS as above:  Medications: Current Outpatient Medications  Medication Sig Dispense Refill  . AMBULATORY NON FORMULARY MEDICATION Continuous positive airway pressure (CPAP) machine auto-titrate from 4-20 cm of H2O pressure, with all supplemental supplies as needed. 1 each 0  . cefdinir (OMNICEF) 300 MG capsule Take 1 capsule (300 mg total) by mouth 2 (two) times daily. 14 capsule 0  . Cholecalciferol (VITAMIN D3) 5000 UNIT/ML LIQD Take by mouth.    . doxycycline (VIBRA-TABS) 100 MG tablet Take 1 tablet (100 mg total) by mouth 2 (two) times daily. 14 tablet 0  . gabapentin (NEURONTIN) 300 MG capsule Take 600 mg by mouth 3 (three) times daily.    . Omega-3 Fatty Acids (FISH OIL) 1000 MG CAPS Take 1,000 mg by mouth daily.    . sertraline (ZOLOFT) 100 MG tablet Take 1 tablet (100 mg total) by mouth  daily. 90 tablet 3  . varenicline (CHANTIX STARTING MONTH PAK) 0.5 MG X 11 & 1 MG X 42 tablet Take one 0.5mg  tablet by mouth once daily for 3 days, then increase to one 0.5mg  tablet twice daily for 3 days, then increase to one 1mg  tablet twice daily. 53 tablet 0  . vitamin B-12 (CYANOCOBALAMIN) 500 MCG tablet Take 1 tablet by mouth daily.     No current facility-administered medications for this visit.    Allergies  Allergen Reactions  . Influenza Vaccines Other (See Comments)    Margarito CourserGianne Barre Syndrome    Health Maintenance Health Maintenance  Topic Date Due  . TETANUS/TDAP  08/19/2018 (Originally 03/27/1999)  . HIV Screening  Completed     Exam:  BP 117/72   Pulse 87   Ht 6' 0.01" (1.829 m)   Wt 282 lb (127.9 kg)   SpO2 96%   BMI 38.24 kg/m  Gen: Well NAD Skin: Right extensor elbow skin still mildly erythematous but no longer tender or indurated.  No fluctuance or expressible pus. Normal elbow motion.    Assessment and Plan: 37 y.o. male with resolving cellulitis.  Antibiotics will expire at this week and.  Prescribed a repeat course printed as a back-up if symptoms worsen.  Recheck as needed.   No orders of the defined types were placed in this encounter.  Meds ordered this encounter  Medications  . cefdinir (OMNICEF) 300 MG capsule    Sig: Take 1 capsule (300 mg total) by mouth  2 (two) times daily.    Dispense:  14 capsule    Refill:  0  . doxycycline (VIBRA-TABS) 100 MG tablet    Sig: Take 1 tablet (100 mg total) by mouth 2 (two) times daily.    Dispense:  14 tablet    Refill:  0     Discussed warning signs or symptoms. Please see discharge instructions. Patient expresses understanding.

## 2018-04-13 NOTE — Patient Instructions (Signed)
Thank you for coming in today. Let me know if symptoms return when antibiotics stop.  I have a backup printed prescription to take if your symptoms recur soon after you stop.

## 2020-01-28 ENCOUNTER — Encounter: Payer: Self-pay | Admitting: Family Medicine

## 2020-01-28 ENCOUNTER — Other Ambulatory Visit: Payer: Self-pay

## 2020-01-28 ENCOUNTER — Ambulatory Visit (INDEPENDENT_AMBULATORY_CARE_PROVIDER_SITE_OTHER): Payer: Managed Care, Other (non HMO) | Admitting: Family Medicine

## 2020-01-28 VITALS — BP 113/77 | HR 74 | Temp 97.7°F | Ht 72.0 in | Wt 253.1 lb

## 2020-01-28 DIAGNOSIS — Z23 Encounter for immunization: Secondary | ICD-10-CM

## 2020-01-28 DIAGNOSIS — Z3009 Encounter for other general counseling and advice on contraception: Secondary | ICD-10-CM

## 2020-01-28 DIAGNOSIS — Z0000 Encounter for general adult medical examination without abnormal findings: Secondary | ICD-10-CM

## 2020-01-28 DIAGNOSIS — E781 Pure hyperglyceridemia: Secondary | ICD-10-CM

## 2020-01-28 LAB — CBC
HCT: 48.1 % (ref 38.5–50.0)
Hemoglobin: 16.6 g/dL (ref 13.2–17.1)
MCH: 30.7 pg (ref 27.0–33.0)
MCHC: 34.5 g/dL (ref 32.0–36.0)
MCV: 89.1 fL (ref 80.0–100.0)
MPV: 10.8 fL (ref 7.5–12.5)
Platelets: 278 10*3/uL (ref 140–400)
RBC: 5.4 10*6/uL (ref 4.20–5.80)
RDW: 12.9 % (ref 11.0–15.0)
WBC: 8.8 10*3/uL (ref 3.8–10.8)

## 2020-01-28 LAB — LIPID PANEL
Cholesterol: 235 mg/dL — ABNORMAL HIGH (ref <200)
HDL: 48 mg/dL (ref >=40)
Non-HDL Cholesterol (Calc): 187 mg/dL (calc) — ABNORMAL HIGH (ref <130)
Total CHOL/HDL Ratio: 4.9 (calc) (ref <5.0)
Triglycerides: 420 mg/dL — ABNORMAL HIGH (ref <150)

## 2020-01-28 LAB — COMPLETE METABOLIC PANEL WITH GFR
AG Ratio: 1.6 (calc) (ref 1.0–2.5)
ALT: 33 U/L (ref 9–46)
AST: 19 U/L (ref 10–40)
Albumin: 4.6 g/dL (ref 3.6–5.1)
Alkaline phosphatase (APISO): 68 U/L (ref 36–130)
BUN: 15 mg/dL (ref 7–25)
CO2: 28 mmol/L (ref 20–32)
Calcium: 9.7 mg/dL (ref 8.6–10.3)
Chloride: 104 mmol/L (ref 98–110)
Creat: 0.77 mg/dL (ref 0.60–1.35)
GFR, Est African American: 132 mL/min/{1.73_m2} (ref >=60)
GFR, Est Non African American: 114 mL/min/{1.73_m2} (ref >=60)
Globulin: 2.8 g/dL (calc) (ref 1.9–3.7)
Glucose, Bld: 101 mg/dL — ABNORMAL HIGH (ref 65–99)
Potassium: 4.5 mmol/L (ref 3.5–5.3)
Sodium: 139 mmol/L (ref 135–146)
Total Bilirubin: 0.5 mg/dL (ref 0.2–1.2)
Total Protein: 7.4 g/dL (ref 6.1–8.1)

## 2020-01-28 NOTE — Assessment & Plan Note (Signed)
Well adult Orders Placed This Encounter  Procedures  . Tdap vaccine greater than or equal to 40yo IM  . COMPLETE METABOLIC PANEL WITH GFR  . CBC  . Lipid Profile  . Ambulatory referral to Urology    Referral Priority:   Routine    Referral Type:   Consultation    Referral Reason:   Specialty Services Required    Requested Specialty:   Urology    Number of Visits Requested:   1  Screening:  Discussed recommendations for colon cancer screening for him would be to start at age 6.   Immunization:  Tdap.  He is currently receiving infusions for CIDP.  ?related to flu vaccine.  Discussed given limited information about COVID vaccine I would recommend holding off for now especially with him currently taking infusions.  He can discuss further with neuro as well.  Referral entered for urology to discuss vasectomy Anticipatory guidance/Risk factor reduction:  Encouraged to stay quit from smoking. Recommend healthy diet rich in fruits and vegetable with regular exercise.  Additional recommendations per AVS.

## 2020-01-28 NOTE — Patient Instructions (Signed)
Great to meet you! I have entered a referral to urology for vasectomy evaluation.  We'll be in touch with lab results.  If all looks well we'll follow up annually or as needed.     Preventive Care 40-40 Years Old, Male Preventive care refers to lifestyle choices and visits with your health care provider that can promote health and wellness. This includes:  A yearly physical exam. This is also called an annual well check.  Regular dental and eye exams.  Immunizations.  Screening for certain conditions.  Healthy lifestyle choices, such as eating a healthy diet, getting regular exercise, not using drugs or products that contain nicotine and tobacco, and limiting alcohol use. What can I expect for my preventive care visit? Physical exam Your health care provider will check:  Height and weight. These may be used to calculate body mass index (BMI), which is a measurement that tells if you are at a healthy weight.  Heart rate and blood pressure.  Your skin for abnormal spots. Counseling Your health care provider may ask you questions about:  Alcohol, tobacco, and drug use.  Emotional well-being.  Home and relationship well-being.  Sexual activity.  Eating habits.  Work and work Statistician. What immunizations do I need?  Influenza (flu) vaccine  This is recommended every year. Tetanus, diphtheria, and pertussis (Tdap) vaccine  You may need a Td booster every 10 years. Varicella (chickenpox) vaccine  You may need this vaccine if you have not already been vaccinated. Human papillomavirus (HPV) vaccine  If recommended by your health care provider, you may need three doses over 6 months. Measles, mumps, and rubella (MMR) vaccine  You may need at least one dose of MMR. You may also need a second dose. Meningococcal conjugate (MenACWY) vaccine  One dose is recommended if you are 16-41 years old and a Market researcher living in a residence hall, or if you have  one of several medical conditions. You may also need additional booster doses. Pneumococcal conjugate (PCV13) vaccine  You may need this if you have certain conditions and were not previously vaccinated. Pneumococcal polysaccharide (PPSV23) vaccine  You may need one or two doses if you smoke cigarettes or if you have certain conditions. Hepatitis A vaccine  You may need this if you have certain conditions or if you travel or work in places where you may be exposed to hepatitis A. Hepatitis B vaccine  You may need this if you have certain conditions or if you travel or work in places where you may be exposed to hepatitis B. Haemophilus influenzae type b (Hib) vaccine  You may need this if you have certain risk factors. You may receive vaccines as individual doses or as more than one vaccine together in one shot (combination vaccines). Talk with your health care provider about the risks and benefits of combination vaccines. What tests do I need? Blood tests  Lipid and cholesterol levels. These may be checked every 5 years starting at age 24.  Hepatitis C test.  Hepatitis B test. Screening   Diabetes screening. This is done by checking your blood sugar (glucose) after you have not eaten for a while (fasting).  Sexually transmitted disease (STD) testing. Talk with your health care provider about your test results, treatment options, and if necessary, the need for more tests. Follow these instructions at home: Eating and drinking   Eat a diet that includes fresh fruits and vegetables, whole grains, lean protein, and low-fat dairy products.  Take vitamin  and mineral supplements as recommended by your health care provider.  Do not drink alcohol if your health care provider tells you not to drink.  If you drink alcohol: ? Limit how much you have to 0-2 drinks a day. ? Be aware of how much alcohol is in your drink. In the [place] one drink equals one 12 oz bottle of beer (355 mL),  one 5 oz glass of wine (148 mL), or one 1 oz glass of hard liquor (44 mL). Lifestyle  Take daily care of your teeth and gums.  Stay active. Exercise for at least 30 minutes on 5 or more days each week.  Do not use any products that contain nicotine or tobacco, such as cigarettes, e-cigarettes, and chewing tobacco. If you need help quitting, ask your health care provider.  If you are sexually active, practice safe sex. Use a condom or other form of protection to prevent STIs (sexually transmitted infections). What's next?  Go to your health care provider once a year for a well check visit.  Ask your health care provider how often you should have your eyes and teeth checked.  Stay up to date on all vaccines. This information is not intended to replace advice given to you by your health care provider. Make sure you discuss any questions you have with your health care provider. Document Revised: 11/30/2018 Document Reviewed: 11/30/2018 Elsevier Patient Education  2020 [employer].

## 2020-01-28 NOTE — Progress Notes (Signed)
Terrance Carlson - 39 y.o. male MRN [id number]  Date of birth: Aug 22, 1981  Subjective Chief Complaint  Patient presents with  . Transitions Of Care  . Annual Exam    HPI Terrance Carlson is a 39 y.o. 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  He would like referral to discuss vasectomy.  He also is wondering if it is safe for him to receive COVID-19 vaccine.  He would like to know when he should have colon cancer screening.  Reports PGF with colon cancer at age 72.  He denies any symptoms or GI problems at this time.    He has been able to quit smoking x3 months.  No desire to restart currently.    He does stay fairly active and exercise occasionally.   He feels like he follow a fairly healthy diet.    Review of Systems  Constitutional: Negative for chills, fever, malaise/fatigue and weight loss.  HENT: Negative for congestion, ear pain and sore throat.   Eyes: Negative for blurred vision, double vision and pain.  Respiratory: Negative for cough and shortness of breath.   Cardiovascular: Negative for chest pain and palpitations.  Gastrointestinal: Negative for abdominal pain, blood in stool, constipation, heartburn and nausea.  Genitourinary: Negative for dysuria and urgency.  Musculoskeletal: Negative for joint pain and myalgias.  Neurological: Negative for dizziness and headaches.  Endo/Heme/Allergies: Does not bruise/bleed easily.  Psychiatric/Behavioral: Negative for depression. The patient is not nervous/anxious and does not have insomnia.     Allergies  Allergen Reactions  . Influenza Vaccines Other (See Comments)    Gianne Barre Syndrome  . Other Other (See Comments)    Patient is allergic to the Flu Shot.    Past Medical History:  Diagnosis Date  . 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 High triglycerides 04/29/2016  . History of melanoma  04/22/2016   Left abdomen status post excision in 2013.   [name] Hyperglycemia 04/29/2016  . OSA (obstructive sleep apnea) 05/31/2017    Past Surgical History:  Procedure Laterality Date  . OLECRANON BURSECTOMY Right 2017    Social History   Socioeconomic History  . Marital status: Married    Spouse name: Not on file  . Number of children: Not on file  . Years of education: Not on file  . Highest education level: Not on file  Occupational History  . Not on file  Tobacco Use  . Smoking status: Former Smoker    Types: Cigarettes  . Smokeless tobacco: Never Used  Substance and Sexual Activity  . Alcohol use: Not Currently    Alcohol/week: 0.0 standard drinks  . Drug use: Never  . Sexual activity: Yes    Partners: Female  Other Topics Concern  . Not on file  Social History Narrative  . Not on file   Social Determinants of Health   Financial Resource Strain:   . Difficulty of Paying Living Expenses: Not on file  Food Insecurity:   . Worried About Programme researcher, broadcasting/film/video in the Last Year: Not on file  . Ran Out of Food in the Last Year: Not on file  Transportation Needs:   . Lack of Transportation (Medical): Not on file  . Lack of Transportation (Non-Medical): Not on file  Physical Activity:   . Days of Exercise per Week: Not on file  . Minutes of Exercise per Session: Not on file  Stress:   .  Feeling of Stress : Not on file  Social Connections:   . Frequency of Communication with Friends and Family: Not on file  . Frequency of Social Gatherings with Friends and Family: Not on file  . Attends Religious Services: Not on file  . Active Member of Clubs or Organizations: Not on file  . Attends Archivist Meetings: Not on file  . Marital Status: Not on file    No family history on file.  Health Maintenance  Topic Date Due  . TETANUS/TDAP  01/27/2030  . HIV Screening  Completed     ----------------------------------------------------------------------------------------------------------------------------------------------------------------------------------------------------------------- Physical Exam BP 113/77 (BP Location: Left Arm, Patient Position: Sitting, Cuff Size: Large)   Pulse 74   Temp 97.7 F (36.5 C) (Oral)   Ht 6' (1.829 m)   Wt 253 lb 1.9 oz (114.8 kg)   BMI 34.33 kg/m   Physical Exam Constitutional:      General: He is not in acute distress. HENT:     Head: Normocephalic and atraumatic.     Right Ear: Tympanic membrane and external ear normal.     Left Ear: Tympanic membrane and external ear normal.     Mouth/Throat:     Mouth: Mucous membranes are moist.  Eyes:     General: No scleral icterus. Neck:     Thyroid: No thyromegaly.  Cardiovascular:     Rate and Rhythm: Normal rate and regular rhythm.     Heart sounds: Normal heart sounds.  Pulmonary:     Effort: Pulmonary effort is normal.     Breath sounds: Normal breath sounds.  Abdominal:     General: Bowel sounds are normal. There is no distension.     Palpations: Abdomen is soft.     Tenderness: There is no abdominal tenderness. There is no guarding.  Musculoskeletal:     Cervical back: Normal range of motion.  Lymphadenopathy:     Cervical: No cervical adenopathy.  Skin:    General: Skin is warm and dry.     Findings: No rash.  Neurological:     General: No focal deficit present.     Mental Status: He is alert and oriented to person, place, and time.     Cranial Nerves: No cranial nerve deficit.     Motor: No abnormal muscle tone.  Psychiatric:        Mood and Affect: Mood normal.        Behavior: Behavior normal.     ------------------------------------------------------------------------------------------------------------------------------------------------------------------------------------------------------------------- Assessment and Plan  Well adult  exam Well adult Orders Placed This Encounter  Procedures  . Tdap vaccine greater than or equal to 7yo IM  . COMPLETE METABOLIC PANEL WITH GFR  . CBC  . Lipid Profile  . Ambulatory referral to Urology    Referral Priority:   Routine    Referral Type:   Consultation    Referral Reason:   Specialty Services Required    Requested Specialty:   Urology    Number of Visits Requested:   1  Screening:  Discussed recommendations for colon cancer screening for him would be to start at age 19.   Immunization:  Tdap.  He is currently receiving infusions for CIDP.  ?related to flu vaccine.  Discussed given limited information about COVID vaccine I would recommend holding off for now especially with him currently taking infusions.  He can discuss further with neuro as well.  Referral entered for urology to discuss vasectomy Anticipatory guidance/Risk factor reduction:  Encouraged to stay quit  from smoking. Recommend healthy diet rich in fruits and vegetable with regular exercise.  Additional recommendations per AVS.      This visit occurred during the SARS-CoV-2 public health emergency.  Safety protocols were in place, including screening questions prior to the visit, additional usage of staff PPE, and extensive cleaning of exam room while observing appropriate contact time as indicated for disinfecting solutions.

## 2020-01-29 ENCOUNTER — Encounter: Payer: Self-pay | Admitting: Family Medicine

## 2020-02-06 ENCOUNTER — Ambulatory Visit (INDEPENDENT_AMBULATORY_CARE_PROVIDER_SITE_OTHER): Payer: Managed Care, Other (non HMO) | Admitting: Family Medicine

## 2020-02-06 ENCOUNTER — Other Ambulatory Visit: Payer: Self-pay

## 2020-02-06 ENCOUNTER — Ambulatory Visit: Payer: Self-pay

## 2020-02-06 ENCOUNTER — Encounter: Payer: Self-pay | Admitting: Family Medicine

## 2020-02-06 ENCOUNTER — Ambulatory Visit (INDEPENDENT_AMBULATORY_CARE_PROVIDER_SITE_OTHER): Payer: Managed Care, Other (non HMO)

## 2020-02-06 VITALS — BP 110/70 | HR 96 | Ht 72.0 in | Wt 256.0 lb

## 2020-02-06 DIAGNOSIS — M25831 Other specified joint disorders, right wrist: Secondary | ICD-10-CM

## 2020-02-06 DIAGNOSIS — M25521 Pain in right elbow: Secondary | ICD-10-CM | POA: Diagnosis not present

## 2020-02-06 NOTE — Patient Instructions (Addendum)
Thank you for coming in today.  Plan for xray today for wrist.  If it bothers you enough we can do surgery referral.  For elbow work on exercises we reviewed.  Use voltaren gel on the elbow and wrist up to 4x daily for pain as needed.  Consider wrist stabilizing brace with bowling.    Golfer's Elbow Rehab Ask your health care provider which exercises are safe for you. Do exercises exactly as told by your health care provider and adjust them as directed. It is normal to feel mild stretching, pulling, tightness, or discomfort as you do these exercises. Stop right away if you feel sudden pain or your pain gets worse. Do not begin these exercises until told by your health care provider. Stretching and range-of-motion exercises These exercises warm up your muscles and joints and improve the movement and flexibility of your elbow. Wrist extension  1. Straighten your left / right elbow in front of you with your palm facing up toward the ceiling. ? If told by your health care provider, bend your left / right elbow to a 90-degree angle (right angle) at your side. 2. With your other hand, gently pull your left / right hand and fingers toward the floor (extension). Stop when you feel a gentle stretch on the palm side of your forearm. 3. Hold this position for __________ seconds. Repeat __________ times. Complete this exercise __________ times a day. Wrist flexion  1. Straighten your left / right elbow in front of you with your palm facing down toward the floor. ? If told by your health care provider, bend your left / right elbow to a 90-degree angle (right angle) at your side. 2. With your other hand, gently push over the back of your left / right hand so your fingers point toward the floor (flexion). Stop when you feel a gentle stretch on the back of your forearm. 3. Hold this position for __________ seconds. Repeat __________ times. Complete this exercise __________ times a day. Forearm rotation,  supination 1. Sit or stand with your elbows at your side. 2. Bend your left / right elbow to a 90-degree angle (right angle). 3. Using your uninjured hand, turn your left / right palm up toward the ceiling (supination) until you feel a gentle stretch along the inside of your forearm. 4. Hold this position for __________ seconds. Repeat __________ times. Complete this exercise __________ times a day. Forearm rotation, pronation 1. Sit or stand with your elbows at your side. 2. Bend your left / right elbow to a 90-degree angle (right angle). 3. Using your uninjured hand, turn your left / right palm down toward the floor (pronation) until you feel a gentle stretch along the top of your forearm. 4. Hold this position for __________ seconds. Repeat __________ times. Complete this exercise __________ times a day. Strengthening exercises These exercises build strength and endurance in your elbow. Endurance is the ability to use your muscles for a long time, even after they get tired. Wrist flexion  1. Sit with your left / right forearm supported on a table or other surface and your palm turned up toward the ceiling. Let your left / right wrist extend over the edge of the surface. 2. Hold a __________ weight or a piece of rubber exercise band or tubing. ? If using a rubber exercise band or tubing, hold the other end of the tubing with your other hand. 3. Slowly bend your wrist so your hand moves up toward the ceiling (  flexion). Try to only move your wrist and keep the rest of your arm still. 4. Hold this position for __________ seconds. 5. Slowly return to the starting position. Repeat __________ times. Complete this exercise __________ times a day. Wrist flexion, eccentric 1. Sit with your left / right forearm palm-up and supported on a table or other surface. Let your left / right wrist extend over the edge of the surface. 2. Hold a __________ weight or a piece of rubber exercise band or tubing in  your left / right hand. ? If using a rubber exercise band or tubing, hold the other end of the tubing with your other hand. 3. Use your uninjured hand to move your left / right hand up toward the ceiling. 4. Take your uninjured hand away and slowly return to the starting position using only your left / right hand (eccentric flexion). Repeat __________ times. Complete this exercise __________ times a day. Forearm rotation, pronation To do this exercise, you will need a lightweight hammer or rubber mallet. 1. Sit with your left / right forearm supported on a table or other surface. Bend your elbow to a 90-degree angle (right angle). Position your forearm so that your palm is facing up toward the ceiling, with your hand resting over the edge of the table. 2. Hold a hammer in your left / right hand. ? To make this exercise easier, hold the hammer near the head of the hammer. ? To make this exercise harder, hold the hammer near the end of the handle. 3. Without moving your elbow, slowly turn (rotate) your forearm so your palm faces down toward the floor (pronation). 4. Hold this position for __________ seconds. 5. Slowly return to the starting position. Repeat __________ times. Complete this exercise __________ times a day. Shoulder blade squeeze 1. Sit in a stable chair or stand with good posture. If you are sitting down, do not let your back touch the back of the chair. 2. Your arms should be at your sides with your elbows bent to a 90-degree angle (right angle). Position your forearms so that your thumbs are facing the ceiling (neutral position). 3. Without lifting your shoulders up, squeeze your shoulder blades tightly together. 4. Hold this position for __________ seconds. 5. Slowly release and return to the starting position. Repeat __________ times. Complete this exercise __________ times a day. This information is not intended to replace advice given to you by your health care provider. Make  sure you discuss any questions you have with your health care provider. Document Revised: 03/29/2019 Document Reviewed: 01/30/2019 Elsevier Patient Education  [employer].

## 2020-02-06 NOTE — Progress Notes (Signed)
I, Wendy Poet, LAT, ATC, am serving as scribe for Dr. Lynne Leader.  Terrance Carlson is a 39 y.o. male who presents to [place] at [facility] today for R elbow pain.  Terrance Carlson been seen by me previously for olecranon bursitis and olecranon bursitis/cellulitis multiple times.  He ultimately did have a bursectomy and has done reasonably well since.  Over the last few weeks the pt reports that R elbow has been tender for about 2 weeks is a 6-7/10 sharp shooting pain that will subside to a dull aching pain and then go away. Patient is not able to extend arm out all the way, hurts worse when having to pick things up and is taking Advil and using ice prn. Patient also would like to have his right wrist looked at states ROM has been worse in the last couple of months thinks possible arthritis.  He notes that he has started bowling again and developed some pain at the medial aspect of his elbow.  Pain worsens with activity and improves with rest.  He notes he also has a little bit of difficulty fully extending his elbow.  He denies any injury.  Additionally he notes a bump or mass on the dorsal aspect of his right wrist.  This prevents him from extending his wrist fully.  He notes this is uncomfortable interferes with his normal activities such as pushing up from a chair or exercise.  Is been present for months to a year now.   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 History of melanoma  Exam:  BP 110/70 (BP Location: Left Arm, Patient Position: Sitting, Cuff Size: Normal)   Pulse 96   Ht 6' (1.829 m)   Wt 256 lb (116.1 kg)   SpO2 96%   BMI 34.72 kg/m  General: Well Developed, well nourished, and in no acute distress.   MSK:  Right elbow: No significant swelling or erythema.  Mature scar overlying olecranon. Range of motion: Extension limited  by about 5 degrees.  Full flexion.  Full pronation supination. Tender palpation mildly at medial epicondyle.  Lateral epicondyle nontender.  Minimally tender near the tip of the olecranon and olecranon fossa. Intact strength elbow motion.  Some pain with resisted wrist flexion and pronation.  Right wrist: Small mass/bump dorsal radial wrist.  Mildly tender.  Not mobile. Wrist motion normal flexion ulnar deviation radial deviation.  Limited extension.  Extension about 30 degrees. Intact strength.  Pulses cap refill sensation intact distally.    Lab and Radiology Results  Diagnostic Limited MSK Ultrasound of:  Right elbow Medial epicondyle relatively normal-appearing.  No bony changes.  Common flexor tendons are intact with no obvious tears.  Slight hypoechoic change of the tip of the medial epicondyle consistent with medial epicondylitis. Lateral epicondyle normal. Olecranon: Slight hypoechoic fluid olecranon fossa.  Intact appearing triceps tendon.  Right wrist: Bony osteophyte from dorsal radius at area of palpable mass.  Slight hypoechoic fluid superficial to bone.  Normal-appearing tendons.  Impression:  Elbow: Medial epicondylitis Slight hypoechoic fluid olecranon fossa  Wrist: Osteophyte dorsal wrist causing impingement  X-ray images right wrist obtained today personally independently reviewed Rounded appearing loose body overlying dorsal distal radius and navicular. Await formal radiology review   Assessment and Plan: 39 y.o. male with  Right elbow pain ongoing for few weeks now.  Exam and ultrasound consistent with medial epicondylitis.  Patient also has a little bit of irritation of the olecranon fossa as well.  Plan for home exercise program and Voltaren gel.  Also recommend wrist bracing with bowling.  Recheck back if not improving.  Dorsal wrist impingement: Patient has what appears to be a loose body on x-ray and ultrasound today.  Formal radiology review is still  pending.  If this is bothering him enough that surgical evaluation is probably the next step.  Will discuss with patient following radiology read likely refer to hand surgery to discuss surgical excision.    Orders Placed This Encounter  Procedures  . [place] LIMITED JOINT SPACE STRUCTURES UP RIGHT(NO LINKED CHARGES)    Order Specific Question:   Reason for Exam (SYMPTOM  OR DIAGNOSIS REQUIRED)    Answer:   eval elbow and wrist pain    Order Specific Question:   Preferred imaging location?    Answer:   Adult [facility]  . DG Wrist Complete Right    Standing Status:   Future    Number of Occurrences:   1    Standing Expiration Date:   04/05/2021    Order Specific Question:   Reason for Exam (SYMPTOM  OR DIAGNOSIS REQUIRED)    Answer:   eval dorsal bony prominance    Order Specific Question:   Preferred imaging location?    Answer:   [facility]    Order Specific Question:   Radiology Contrast Protocol - do NOT remove file path    Answer:   \\[email or web address]   No orders of the defined types were placed in this encounter.    Discussed warning signs or symptoms. Please see discharge instructions. Patient expresses understanding.   The above documentation has been reviewed and is accurate and complete [name]

## 2020-02-07 ENCOUNTER — Ambulatory Visit: Payer: Managed Care, Other (non HMO) | Admitting: Family Medicine

## 2020-02-08 ENCOUNTER — Encounter: Payer: Self-pay | Admitting: Family Medicine

## 2020-02-08 DIAGNOSIS — M25521 Pain in right elbow: Secondary | ICD-10-CM

## 2020-02-08 DIAGNOSIS — M25831 Other specified joint disorders, right wrist: Secondary | ICD-10-CM

## 2020-02-08 NOTE — Progress Notes (Signed)
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  There is also a small bone at the top side of the wrist that looks like an old piece of bone that got knocked off in the past.  This is probably the thing that is getting in the way preventing you from extending your wrist.  Do you want me to refer you to hand surgery like we discussed?

## 2020-02-18 ENCOUNTER — Ambulatory Visit (INDEPENDENT_AMBULATORY_CARE_PROVIDER_SITE_OTHER): Payer: Managed Care, Other (non HMO) | Admitting: Rehabilitative and Restorative Service Providers"

## 2020-02-18 ENCOUNTER — Other Ambulatory Visit: Payer: Self-pay

## 2020-02-18 DIAGNOSIS — M25531 Pain in right wrist: Secondary | ICD-10-CM | POA: Diagnosis not present

## 2020-02-18 DIAGNOSIS — M6281 Muscle weakness (generalized): Secondary | ICD-10-CM

## 2020-02-18 DIAGNOSIS — M25521 Pain in right elbow: Secondary | ICD-10-CM | POA: Diagnosis not present

## 2020-02-18 DIAGNOSIS — R29898 Other symptoms and signs involving the musculoskeletal system: Secondary | ICD-10-CM | POA: Diagnosis not present

## 2020-02-18 NOTE — Patient Instructions (Signed)
Access Code: [email or web address]  URL: [email or web address]  Date: 02/18/2020  Prepared by: Margretta Ditty   Exercises Ulnar Nerve Flossing - 10 reps - 1 sets - 2x daily - 7x weekly Ulnar Nerve Mobilization - Low Level - 10 reps - 1 sets - 2x daily - 7x weekly Doorway Pec Stretch at 60 Degrees Abduction with Arm Straight - 3 reps - 1 sets - 30 seconds hold - 2x daily - 7x weekly

## 2020-02-18 NOTE — Therapy (Signed)
[place], [place], [address] Phone: [phone]   Fax:  [phone]  Physical Therapy Evaluation  Patient Details  Name: Terrance Carlson MRN: [id number] Date of Birth: 30-Jul-1981 Referring Provider (PT): Lynne Leader, MD   Encounter Date: 02/18/2020  PT End of Session - 02/18/20 2001    Visit Number  1    Number of Visits  12    Date for PT Re-Evaluation  03/31/20    PT Start Time  3474    PT Stop Time  1433    PT Time Calculation (min)  45 min       Past Medical History:  Diagnosis Date  . CIDP (chronic inflammatory demyelinating polyneuropathy) ([place]) 08/12/2017  . High triglycerides 04/29/2016  . History of melanoma 04/22/2016   Left abdomen status post excision in 2013.   [name] Hyperglycemia 04/29/2016  . OSA (obstructive sleep apnea) 05/31/2017    Past Surgical History:  Procedure Laterality Date  . OLECRANON BURSECTOMY Right 2017    There were no vitals filed for this visit.   Subjective Assessment - 02/18/20 1351    Subjective  The patient reports he cannot straighten his elbow.  He has history of R elbow bursectomy 3-4 years ago due to swelling/inflammation.  He also has R wrist pain that began approximately 18 months ago.  He bowls and felt like it could be sore from overuse.  In addition, the patient has CIDP beginning in July 2018.    Pertinent History  CIDP    Patient Stated Goals  "The main thing I'm worried about is pain."  He notes he would love to straighten it out if able.    Currently in Pain?  Yes    Pain Score  5    up to 7/10 at worse   Pain Location  Elbow    Pain Orientation  Right    Pain Onset  More than a month ago    Pain Frequency  Intermittent    Aggravating Factors   end range flexion provokes pain (when sleeping and tucks arm under his head at night)    Pain Relieving Factors  unsure (has tried biofreeze, ice)         [facility] PT Assessment - 02/18/20 1357      Assessment   Medical Diagnosis  M25.521 (ICD-10-CM) - Right elbow pain    Referring Provider (PT)  Lynne Leader, MD    Onset Date/Surgical Date  02/11/20    Hand Dominance  Right    Prior Therapy  none      Precautions   Precautions  None    Precaution Comments  h/o olecranon bursitis      Restrictions   Weight Bearing Restrictions  No      Balance Screen   Has the patient fallen in the past 6 months  No    Has the patient had a decrease in activity level because of a fear of falling?   No    Is the patient reluctant to leave their home because of a fear of falling?   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)    Leisure  bowls,does yoga      Observation/Other Assessments   Focus on Therapeutic  Outcomes (FOTO)   59% (41% limited)      Observation/Other Assessments-Edema    Edema  --   edematous proximal to elbow     Sensation   Light Touch  Impaired Detail    Additional Comments  gets more tingling as he gets near IVIG treatment for IVIG      ROM / Strength   AROM / PROM / Strength  AROM;Strength;PROM      AROM   Overall AROM   Deficits    AROM Assessment Site  Elbow;Wrist    Right/Left Elbow  Right;Left    Right Elbow Flexion  130    Right Elbow Extension  -20    Left Elbow Flexion  142    Left Elbow Extension  0    Right/Left Wrist  Right    Right Wrist Extension  10 Degrees    Right Wrist Flexion  40 Degrees      PROM   Overall PROM   Deficits    PROM Assessment Site  Wrist    Right/Left Wrist  Right    Right Wrist Extension  60 Degrees    Right Wrist Flexion  75 Degrees      Strength   Overall Strength  Deficits    Strength Assessment Site  Elbow;Wrist;Forearm;Hand    Right/Left Elbow  Right    Right Elbow Flexion  4+/5    Right Elbow Extension  4/5   with pain   Right/Left  Forearm  Right    Right Forearm Pronation  4/5    Right Forearm Supination  4/5    Right/Left Wrist  Right    Right Wrist Flexion  4/5   pain in elbow medial   Right Wrist Extension  4/5    Right/Left hand  Right   R thumb 3+/5 for MCP flex/ext; 4/5 resisted opposition     Flexibility   Soft Tissue Assessment /Muscle Length  --   tightness in medial aspect of elbow     Palpation   Palpation comment  Significantly tender over R distal triceps moving medial to epicondyle.      Special Tests   Other special tests  tinel's sign at the elbow   positive               Objective measurements completed on examination: See above findings.      [facility] Adult PT Treatment/Exercise - 02/18/20 2032      Exercises   Exercises  Elbow;Other Exercises    Other Exercises   doorway pec stretch.      Elbow Exercises   Other elbow exercises  Tried:  palms together in prayer and moving UEs side to side for ulnar nerve flossing-- unable to keep palms together due to R wrist tightness.      Other elbow exercises  Performed 2 ulnar nerve glides that patient was able to do for HEP *see medbridge handout.       Modalities   Modalities  Iontophoresis      Iontophoresis   Type of Iontophoresis  Dexamethasone    Location  R medial (just proximal to) epicondyle    Dose  1.0cc    Time  4 hour patch      Manual Therapy   Manual Therapy  Soft tissue mobilization    Manual therapy comments  tender to palpation and PT modified force due to pain    Soft tissue mobilization  IASTM and STM triceps  PT Education - 02/18/20 2000    Education Details  HEP, ice x 20 minutes    Person(s) Educated  Patient    Methods  Explanation;Demonstration;Handout    Comprehension  Returned demonstration;Verbalized understanding          PT Long Term Goals - 02/18/20 2002      PT LONG TERM GOAL #1   Title  The patient will be able to return demo HEP indep.    Time  6    Period   Weeks    Target Date  03/31/20      PT LONG TERM GOAL #2   Title  The patient will report functional limitation per FOTO < or equal to 30%.    Baseline  41% limitation baseline    Time  6    Period  Weeks    Target Date  03/31/20      PT LONG TERM GOAL #3   Title  The patient will report resting pain R elbow < or equal to 2/10.    Baseline  5/10    Time  6    Period  Weeks    Target Date  03/31/20      PT LONG TERM GOAL #4   Title  The patient will imrpove R elbow extension from -20 to -10 degrees.    Time  6    Period  Weeks    Target Date  03/31/20      PT LONG TERM GOAL #5   Title  The patient will improve R elbow strength to 5/5.    Baseline  4/5    Time  6    Period  Weeks    Target Date  03/31/20      Additional Long Term Goals   Additional Long Term Goals  Yes      PT LONG TERM GOAL #6   Title  The patient will improve R wrist extension from 10 degrees up to 20 degrees for improved use of R UE during reaching tasks.    Time  6    Period  Weeks    Target Date  03/31/20             Plan - 02/18/20 2053    Clinical Impression Statement  The patient is a 39 yo male presenting with pain in R elbow and R wrist with h/o olecranon bursectomy.  He presents today with impairments in R elbow ROM for flexion/extension, R wrist ROM for flexion/extension, pain R medial elbow, trigger points and myofascial tightness R triceps, weakness in hand musculature from h/o CIDP, weakness R elbow and wrist musculature.  These impairments lead to functional limitations of decreased ability to lift objects with the R UE and pain limiting function.  PT to address deficits and optimize functional use of R UE.    Personal Factors and Comorbidities  Comorbidity 1;Comorbidity 2    Comorbidities  h/o olecranon bursectomy, h/o CIDP    Examination-Activity Limitations  Lift;Reach Overhead;Carry    Stability/Clinical Decision Making  Stable/Uncomplicated    Clinical Decision Making  Low     Rehab Potential  Good    PT Frequency  2x / week   anticipate reducing to 1x/week once iontophoresis course completed and exercise program well established.   PT Duration  6 weeks    PT Treatment/Interventions  ADLs/Self Care Home Management;Cryotherapy;Electrical Stimulation;Iontophoresis 4mg /ml Dexamethasone;Ultrasound;Therapeutic activities;Therapeutic exercise;Neuromuscular re-education;Manual techniques;Taping;Dry needling;Passive range of motion;Patient/family education    PT Next Visit Plan  STM and IASTM R posteromedial arm musculature, stretching added to HEP for wrist flexion/extension, wrist mobiizations to tolerance, add elbow extension stretch to HEP and self mobiizations of the wrist (watching finger extension).    PT Home Exercise Plan  Access Code: [email or web address]    Consulted and Agree with Plan of Care  Patient       Patient will benefit from skilled therapeutic intervention in order to improve the following deficits and impairments:  Decreased range of motion, Decreased strength, Increased fascial restricitons, Pain, Hypomobility, Impaired flexibility, Postural dysfunction  Visit Diagnosis: Pain in right elbow  Pain in right wrist  Muscle weakness (generalized)  Other symptoms and signs involving the musculoskeletal system     Problem List Patient Active Problem List   Diagnosis Date Noted  . Well adult exam 01/28/2020  . Depression, major, single episode, moderate (HCC) 08/19/2017  . 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 OSA (obstructive sleep apnea) 05/31/2017  . Constipation 07/29/2016  . Vitamin D deficiency 04/29/2016  . High triglycerides 04/29/2016  . Hyperglycemia 04/29/2016  . Morbid obesity (HCC) 04/22/2016  . History of melanoma 04/22/2016    Amilya Haver, PT 02/18/2020, 9:53 PM  [facility] [address] [place] [address] [place], [place], [address] Phone: [phone]    Fax:  [phone]  Name: Terrance Carlson MRN: [id number] Date of Birth: 07/08/1981

## 2020-02-20 ENCOUNTER — Encounter: Payer: Self-pay | Admitting: Physical Therapy

## 2020-02-20 ENCOUNTER — Ambulatory Visit (INDEPENDENT_AMBULATORY_CARE_PROVIDER_SITE_OTHER): Payer: Managed Care, Other (non HMO) | Admitting: Physical Therapy

## 2020-02-20 ENCOUNTER — Other Ambulatory Visit: Payer: Self-pay

## 2020-02-20 DIAGNOSIS — R29898 Other symptoms and signs involving the musculoskeletal system: Secondary | ICD-10-CM

## 2020-02-20 DIAGNOSIS — M25531 Pain in right wrist: Secondary | ICD-10-CM | POA: Diagnosis not present

## 2020-02-20 DIAGNOSIS — M25521 Pain in right elbow: Secondary | ICD-10-CM | POA: Diagnosis not present

## 2020-02-20 DIAGNOSIS — M6281 Muscle weakness (generalized): Secondary | ICD-10-CM | POA: Diagnosis not present

## 2020-02-20 NOTE — Patient Instructions (Signed)

## 2020-02-20 NOTE — Therapy (Signed)
[place], [place], [address] Phone: [phone]   Fax:  [phone]  Physical Therapy Treatment  Patient Details  Name: Terrance Carlson MRN: [id number] Date of Birth: 07/04/1981 Referring Provider (PT): Lynne Leader, MD   Encounter Date: 02/20/2020  PT End of Session - 02/20/20 1019    Visit Number  2    Number of Visits  12    Date for PT Re-Evaluation  03/31/20    PT Start Time  1019    PT Stop Time  1110    PT Time Calculation (min)  51 min    Activity Tolerance  Patient tolerated treatment well    Behavior During Therapy  [facility] for tasks assessed/performed       Past Medical History:  Diagnosis Date  . CIDP (chronic inflammatory demyelinating polyneuropathy) ([place]) 08/12/2017  . High triglycerides 04/29/2016  . History of melanoma 04/22/2016   Left abdomen status post excision in 2013.   [name] Hyperglycemia 04/29/2016  . OSA (obstructive sleep apnea) 05/31/2017    Past Surgical History:  Procedure Laterality Date  . OLECRANON BURSECTOMY Right 2017    There were no vitals filed for this visit.  Subjective Assessment - 02/20/20 1020    Subjective  Pt reports he had good relief with ionto patch last session, however has a bruise from pulling patch off.   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    Pertinent History  CIDP    Patient Stated Goals  "The main thing I'm worried about is pain."  He notes he would love to straighten it out if able.    Currently in Pain?  Yes    Pain Score  6     Pain Location  Elbow    Pain Orientation  Right    Pain Descriptors / Indicators  --   intermittent   Pain Onset  More than a month ago    Aggravating Factors   end range flexion    Pain Relieving Factors  unsure         [facility] PT Assessment - 02/20/20 0001      Assessment   Medical Diagnosis  M25.521 (ICD-10-CM) - Right elbow pain    Referring Provider (PT)  Lynne Leader, MD    Onset Date/Surgical Date  02/11/20    Hand Dominance  Right    Prior Therapy  none       [facility] Adult PT Treatment/Exercise - 02/20/20 0001      Elbow Exercises   Other elbow exercises  Rt ulnar nerve glides x 10, ulnar nerve mobilization x 10 (cues to correct form). bilat shoulder ext holding dowel x 10    Other elbow exercises  Rt tricep stretch x 20 sec, (repeated on LUE for comparison);  bilat bicep stretch hlding door frame x 20 sec x 2;  unilateral Rt arm bicep stretch x 30 sec x 2 reps.   Supine Rt elbow stretch holding 1# x 2 min (low load, long duration stretch)       Shoulder Exercises: ROM/Strengthening   UBE (Upper Arm Bike)  L2:  1 min forward/1 min backward      Modalities   Modalities  Iontophoresis;Electrical Stimulation;Moist Heat      Moist Heat Therapy   Number Minutes Moist Heat  10 Minutes    Moist Heat Location  Elbow      Electrical Stimulation  Electrical Stimulation Location  Rt bicep / tricep     Electrical Stimulation Action  IFC     Electrical Stimulation Parameters  intensity to tolerance x 10 min     Electrical Stimulation Goals  Pain      Iontophoresis   Type of Iontophoresis  Dexamethasone    Location  R elbow at distal tricep    Dose  1.0cc    Time  80 mA stat patch, 4 hour patch      Manual Therapy   Manual therapy comments  limited tolerance to IASTM.    skilled palpation and monitoring during DN   Soft tissue mobilization  IASTM to Rt biceps (distal) and STM to Rt wrist flexors        Trigger Point Dry Needling - 02/20/20 0001    Consent Given?  Yes    Education Handout Provided  Yes    Muscles Treated Upper Quadrant  Brachioradialis;Biceps    Muscles Treated Wrist/Hand  Flexor carpi radialis;Flexor carpi ulnaris    Dry Needling Comments  all in Rt arm    Biceps Response  Palpable increased muscle length    Brachioradialis Response  Palpable increased muscle length    Flexor carpi radialis Response  Palpable increased  muscle length;Twitch response elicited    Flexor carpi ulnaris Response  Palpable increased muscle length;Twitch response elicited        Roderic Scarce, PT 02/20/20 12:21 PM     PT Education - 02/20/20 1049    Education Details  DN info    Person(s) Educated  Patient    Methods  Explanation;Handout    Comprehension  Verbalized understanding          PT Long Term Goals - 02/18/20 2002      PT LONG TERM GOAL #1   Title  The patient will be able to return demo HEP indep.    Time  6    Period  Weeks    Target Date  03/31/20      PT LONG TERM GOAL #2   Title  The patient will report functional limitation per FOTO < or equal to 30%.    Baseline  41% limitation baseline    Time  6    Period  Weeks    Target Date  03/31/20      PT LONG TERM GOAL #3   Title  The patient will report resting pain R elbow < or equal to 2/10.    Baseline  5/10    Time  6    Period  Weeks    Target Date  03/31/20      PT LONG TERM GOAL #4   Title  The patient will imrpove R elbow extension from -20 to -10 degrees.    Time  6    Period  Weeks    Target Date  03/31/20      PT LONG TERM GOAL #5   Title  The patient will improve R elbow strength to 5/5.    Baseline  4/5    Time  6    Period  Weeks    Target Date  03/31/20      Additional Long Term Goals   Additional Long Term Goals  Yes      PT LONG TERM GOAL #6   Title  The patient will improve R wrist extension from 10 degrees up to 20 degrees for improved use of R UE during reaching tasks.    Time  6    Period  Weeks    Target Date  03/31/20            Plan - 02/20/20 1214    Clinical Impression Statement  Pt had positive response to ionto patch last visit.  He required minor cues for technique of HEP exercises; tolerated without increase in pain.  Per Roderic Scarce, supervising PT whom performed DN, pt had positive twitch response in Rt forearm (greater than in bicep area).  Pt very sensitive with IASTM to Rt bicep area.  Goals are ongoing.    Personal Factors and Comorbidities  Comorbidity 1;Comorbidity 2    Comorbidities  h/o olecranon bursectomy, h/o CIDP    Examination-Activity Limitations  Lift;Reach Overhead;Carry    Stability/Clinical Decision Making  Stable/Uncomplicated    Rehab Potential  Good    PT Frequency  2x / week   anticipate reducing to 1x/week once iontophoresis course completed and exercise program well established.   PT Duration  6 weeks    PT Treatment/Interventions  ADLs/Self Care Home Management;Cryotherapy;Electrical Stimulation;Iontophoresis 4mg /ml Dexamethasone;Ultrasound;Therapeutic activities;Therapeutic exercise;Neuromuscular re-education;Manual techniques;Taping;Dry needling;Passive range of motion;Patient/family education    PT Next Visit Plan  add wrist mobiizations to tolerance and self mobiizations of the wrist (watching finger extension).  assess response to DN/ ionto    PT Home Exercise Plan  Access Code: [email or web address]    Consulted and Agree with Plan of Care  Patient       Patient will benefit from skilled therapeutic intervention in order to improve the following deficits and impairments:  Decreased range of motion, Decreased strength, Increased fascial restricitons, Pain, Hypomobility, Impaired flexibility, Postural dysfunction  Visit Diagnosis: Pain in right elbow  Pain in right wrist  Muscle weakness (generalized)  Other symptoms and signs involving the musculoskeletal system     Problem List Patient Active Problem List   Diagnosis Date Noted  . Well adult exam 01/28/2020  . Depression, major, single episode, moderate (HCC) 08/19/2017  . 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 OSA (obstructive sleep apnea) 05/31/2017  . Constipation 07/29/2016  . Vitamin D deficiency 04/29/2016  . High triglycerides 04/29/2016  . Hyperglycemia 04/29/2016  . Morbid obesity (HCC) 04/22/2016  . History of melanoma 04/22/2016   06/22/2016, PTA 02/20/20 12:22 PM  [facility] [place] [address] [place], [place], [place] Phone: [phone]   Fax:  [phone]  Name: Terrance Carlson MRN: [name] Date of Birth: January 08, 1981

## 2020-02-25 ENCOUNTER — Other Ambulatory Visit: Payer: Self-pay

## 2020-02-25 ENCOUNTER — Ambulatory Visit (INDEPENDENT_AMBULATORY_CARE_PROVIDER_SITE_OTHER): Payer: Managed Care, Other (non HMO) | Admitting: Physical Therapy

## 2020-02-25 DIAGNOSIS — M25531 Pain in right wrist: Secondary | ICD-10-CM | POA: Diagnosis not present

## 2020-02-25 DIAGNOSIS — M25521 Pain in right elbow: Secondary | ICD-10-CM | POA: Diagnosis not present

## 2020-02-25 DIAGNOSIS — M6281 Muscle weakness (generalized): Secondary | ICD-10-CM

## 2020-02-25 DIAGNOSIS — R29898 Other symptoms and signs involving the musculoskeletal system: Secondary | ICD-10-CM | POA: Diagnosis not present

## 2020-02-25 NOTE — Therapy (Signed)
[place], [place], [address] Phone: [phone]   Fax:  [phone]  Physical Therapy Treatment  Patient Details  Name: Terrance Carlson MRN: [id number] Date of Birth: [birth year] Referring Provider (PT): Lynne Leader, MD   Encounter Date: 02/25/2020  PT End of Session - 02/25/20 1312    Visit Number  3    Number of Visits  12    Date for PT Re-Evaluation  03/31/20    PT Start Time  1150    PT Stop Time  1230    PT Time Calculation (min)  40 min    Activity Tolerance  Patient tolerated treatment well    Behavior During Therapy  [facility] for tasks assessed/performed       Past Medical History:  Diagnosis Date  . CIDP (chronic inflammatory demyelinating polyneuropathy) ([place]) 08/12/2017  . High triglycerides 04/29/2016  . History of melanoma 04/22/2016   Left abdomen status post excision in 2013.   [name] Hyperglycemia 04/29/2016  . OSA (obstructive sleep apnea) 05/31/2017    Past Surgical History:  Procedure Laterality Date  . OLECRANON BURSECTOMY Right 2017    There were no vitals filed for this visit.  Subjective Assessment - 02/25/20 1151    Subjective  Pt reports he did not bowl last week, and has subs lined up for bowling this week.  He much less pain with ionto patch and tape on Rt arm.  He is reporting less shooting pains in elbow.    Pertinent History  CIDP    Patient Stated Goals  "The main thing I'm worried about is pain."  He notes he would love to straighten it out if able.    Currently in Pain?  Yes    Pain Score  5     Pain Location  Elbow    Pain Orientation  Right    Pain Descriptors / Indicators  Sore    Pain Onset  More than a month ago    Aggravating Factors   WB through arm, end range (flex/ ext)    Pain Relieving Factors  unsure         [facility] PT Assessment - 02/25/20 0001      Assessment   Medical Diagnosis  M25.521 (ICD-10-CM) - Right elbow pain    Referring Provider (PT)   Lynne Leader, MD    Onset Date/Surgical Date  02/11/20    Hand Dominance  Right    Prior Therapy  none      AROM   Right Elbow Flexion  128    Right Elbow Extension  -24    Left Elbow Flexion  135    Left Elbow Extension  0    Right Wrist Extension  38 Degrees    Right Wrist Flexion  57 Degrees       [facility] Adult PT Treatment/Exercise - 02/25/20 0001      Self-Care   Self-Care  Other Self-Care Comments;Heat/Ice Application    Heat/Ice Application  educated pt on parameters for ice massage to tricep    Other Self-Care Comments   pt educated on self PNF stretch of elbow; pt verbalized understanding.       Elbow Exercises   Other elbow exercises  Rt ulnar nerve glides x 3 , ulnar nerve mobilization x 3    Other elbow exercises  Rt wrist stretch into pronation and flexion x 20 sec x 2, to tolerance.        Shoulder Exercises:  Stretch   Table Stretch - Flexion  1 rep;30 seconds    Other Shoulder Stretches  doorway pec stretch x 60 sec x 3      Wrist Exercises   Wrist Extension  AAROM;Right;5 reps   self distraction at wrist, then holding palm on wall   Wrist Extension Limitations  15 sec reps     Other wrist exercises  prayer stretch x 20 sec.      Other wrist exercises  forearm pronation/ supination with knuckles (in fist) on table x 3 reps, to tolerance.     Forearm Supination  AROM;Right;10 reps    Forearm Pronation  AROM;Right;10 reps      Iontophoresis   Type of Iontophoresis  Dexamethasone    Location  Rt distal tricep     Dose  1.0cc    Time  80 mA stat patch, 6 hour patch      Manual Therapy   Manual Therapy  Myofascial release;Passive ROM;Soft tissue mobilization    Soft tissue mobilization  IASTM to Rt biceps, brachialis, brachioradialis, Rt wrist flexors, distal tricep;  STM to same areas.     Myofascial Release  Rt biceps muscle     Passive ROM  Rt elbow into ext (following 6 reps of PNF pattern)                   PT Long Term Goals - 02/25/20 1319       PT LONG TERM GOAL #1   Title  The patient will be able to return demo HEP indep.    Time  6    Period  Weeks    Status  On-going      PT LONG TERM GOAL #2   Title  The patient will report functional limitation per FOTO < or equal to 30%.    Baseline  41% limitation baseline    Time  6    Period  Weeks    Status  On-going      PT LONG TERM GOAL #3   Title  The patient will report resting pain R elbow < or equal to 2/10.    Baseline  5/10    Time  6    Period  Weeks    Status  On-going      PT LONG TERM GOAL #4   Title  The patient will imrpove R elbow extension from -20 to -10 degrees.    Time  6    Period  Weeks    Status  On-going      PT LONG TERM GOAL #5   Title  The patient will improve R elbow strength to 5/5.    Baseline  4/5    Time  6    Period  Weeks    Status  On-going      PT LONG TERM GOAL #6   Title  The patient will improve R wrist extension from 10 degrees up to 20 degrees for improved use of R UE during reaching tasks.    Time  6    Period  Weeks    Status  Achieved            Plan - 02/25/20 1314    Clinical Impression Statement  Pt had positive response to DN, ktape, and ionto.  Pt has had progress in Rt wrist ROM, but elbow ROM remains similar.  Pt less sensitive to IASTM this visit.  Has met LTG #6 and is Progressing towards  remaining goals.    Personal Factors and Comorbidities  Comorbidity 1;Comorbidity 2    Comorbidities  h/o olecranon bursectomy, h/o CIDP    Examination-Activity Limitations  Lift;Reach Overhead;Carry    Stability/Clinical Decision Making  Stable/Uncomplicated    Rehab Potential  Good    PT Frequency  2x / week   anticipate reducing to 1x/week once iontophoresis course completed and exercise program well established.   PT Duration  6 weeks    PT Treatment/Interventions  ADLs/Self Care Home Management;Cryotherapy;Electrical Stimulation;Iontophoresis 30m/ml Dexamethasone;Ultrasound;Therapeutic activities;Therapeutic  exercise;Neuromuscular re-education;Manual techniques;Taping;Dry needling;Passive range of motion;Patient/family education    PT Next Visit Plan  continue progressive elbow / wrist/ shoulder ROM.    PT Home Exercise Plan  Access Code: [email or web address]   Consulted and Agree with Plan of Care  Patient       Patient will benefit from skilled therapeutic intervention in order to improve the following deficits and impairments:  Decreased range of motion, Decreased strength, Increased fascial restricitons, Pain, Hypomobility, Impaired flexibility, Postural dysfunction  Visit Diagnosis: Pain in right elbow  Pain in right wrist  Muscle weakness (generalized)  Other symptoms and signs involving the musculoskeletal system     Problem List Patient Active Problem List   Diagnosis Date Noted  . Well adult exam 01/28/2020  . Depression, major, single episode, moderate ([place] 08/19/2017  . CIDP (chronic inflammatory demyelinating polyneuropathy) ([place] 08/12/2017  . OSA (obstructive sleep apnea) 05/31/2017  . Constipation 07/29/2016  . Vitamin D deficiency 04/29/2016  . High triglycerides 04/29/2016  . Hyperglycemia 04/29/2016  . Morbid obesity ([place] 04/22/2016  . History of melanoma 04/22/2016   JKerin Perna PTA 02/25/20 1:19 PM  [place] [address]Phone: [phone]  Fax:  [phone] Name: KDavanta MeuserMRN: [id number] of Birth: 403-30-1982

## 2020-03-04 ENCOUNTER — Ambulatory Visit (INDEPENDENT_AMBULATORY_CARE_PROVIDER_SITE_OTHER): Payer: Managed Care, Other (non HMO) | Admitting: Rehabilitative and Restorative Service Providers"

## 2020-03-04 ENCOUNTER — Other Ambulatory Visit: Payer: Self-pay

## 2020-03-04 ENCOUNTER — Encounter: Payer: Self-pay | Admitting: Rehabilitative and Restorative Service Providers"

## 2020-03-04 DIAGNOSIS — M25521 Pain in right elbow: Secondary | ICD-10-CM | POA: Diagnosis not present

## 2020-03-04 DIAGNOSIS — M25531 Pain in right wrist: Secondary | ICD-10-CM | POA: Diagnosis not present

## 2020-03-04 DIAGNOSIS — R29898 Other symptoms and signs involving the musculoskeletal system: Secondary | ICD-10-CM

## 2020-03-04 DIAGNOSIS — M6281 Muscle weakness (generalized): Secondary | ICD-10-CM | POA: Diagnosis not present

## 2020-03-04 NOTE — Therapy (Signed)
[facility] [place] [address] [place] [address] [place], [place], [address] Phone: [phone]   Fax:  [phone]  Physical Therapy Treatment  Patient Details  Name: Terrance Carlson MRN: [id number] Date of Birth: 07/17/1981 Referring Provider (PT): [name], MD   Encounter Date: 03/04/2020  PT End of Session - 03/04/20 1057    Visit Number  4    Number of Visits  12    Date for PT Re-Evaluation  03/31/20    PT Start Time  1017    PT Stop Time  1100   moist heat end of treatment   PT Time Calculation (min)  43 min    Activity Tolerance  Patient tolerated treatment well       Past Medical History:  Diagnosis Date  . 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 High triglycerides 04/29/2016  . History of melanoma 04/22/2016   Left abdomen status post excision in 2013.   [name] Hyperglycemia 04/29/2016  . OSA (obstructive sleep apnea) 05/31/2017    Past Surgical History:  Procedure Laterality Date  . OLECRANON BURSECTOMY Right 2017    There were no vitals filed for this visit.  Subjective Assessment - 03/04/20 1058    Subjective  DN seemed to help last time. He has continued with his HEP    Currently in Pain?  Yes    Pain Score  5     Pain Location  Elbow    Pain Orientation  Right    Pain Descriptors / Indicators  Sore;Nagging;Aching    Pain Type  Chronic pain    Pain Onset  More than a month ago    Pain Frequency  Intermittent                       [facility] Adult PT Treatment/Exercise - 03/04/20 0001      Shoulder Exercises: Stretch   Table Stretch - Flexion  1 rep;30 seconds    Other Shoulder Stretches  doorway pec stretch x 60 sec x 3      Wrist Exercises   Other wrist exercises  prayer stretch x 20 sec.        Moist Heat Therapy   Number Minutes Moist Heat  10 Minutes    Moist Heat Location  Elbow   triceps/biceps/forearm      Manual Therapy   Manual therapy comments  skilled  palpation of tissue for DN    Soft tissue mobilization  deep tissue work through [employer] UE - working through biceps; triceps; flexor and extensor forearm     Passive ROM  Rt elbow into ext - 2-3 reps 10-15 sec hold        Trigger Point Dry Needling - 03/04/20 0001    Consent Given?  Yes    Education Handout Provided  Previously provided    Dry Needling Comments  Rt     Biceps Response  Palpable increased muscle length    Triceps Response  Palpable increased muscle length    Brachioradialis Response  Palpable increased muscle length    Flexor carpi radialis Response  Palpable increased muscle length;Twitch response elicited    Flexor carpi ulnaris Response  Palpable increased muscle length;Twitch response elicited                PT Long Term Goals - 02/25/20 1319      PT LONG TERM GOAL #1   Title  The patient will be able to return  demo HEP indep.    Time  6    Period  Weeks    Status  On-going      PT LONG TERM GOAL #2   Title  The patient will report functional limitation per FOTO < or equal to 30%.    Baseline  41% limitation baseline    Time  6    Period  Weeks    Status  On-going      PT LONG TERM GOAL #3   Title  The patient will report resting pain R elbow < or equal to 2/10.    Baseline  5/10    Time  6    Period  Weeks    Status  On-going      PT LONG TERM GOAL #4   Title  The patient will imrpove R elbow extension from -20 to -10 degrees.    Time  6    Period  Weeks    Status  On-going      PT LONG TERM GOAL #5   Title  The patient will improve R elbow strength to 5/5.    Baseline  4/5    Time  6    Period  Weeks    Status  On-going      PT LONG TERM GOAL #6   Title  The patient will improve R wrist extension from 10 degrees up to 20 degrees for improved use of R UE during reaching tasks.    Time  6    Period  Weeks    Status  Achieved            Plan - 03/04/20 1100    Clinical Impression Statement  Continued tenderness and tightness  to palpation Rt UE. Tolerated DN well with good release of tissue tightness.    Rehab Potential  Good    PT Frequency  2x / week    PT Duration  6 weeks    PT Treatment/Interventions  ADLs/Self Care Home Management;Cryotherapy;Electrical Stimulation;Iontophoresis 4mg /ml Dexamethasone;Ultrasound;Therapeutic activities;Therapeutic exercise;Neuromuscular re-education;Manual techniques;Taping;Dry needling;Passive range of motion;Patient/family education    PT Next Visit Plan  continue progressive elbow / wrist/ shoulder ROM; assess response to DN    PT Home Exercise Plan  Access Code: [email or web address]    Consulted and Agree with Plan of Care  Patient       Patient will benefit from skilled therapeutic intervention in order to improve the following deficits and impairments:     Visit Diagnosis: Pain in right elbow  Pain in right wrist  Muscle weakness (generalized)  Other symptoms and signs involving the musculoskeletal system     Problem List Patient Active Problem List   Diagnosis Date Noted  . Well adult exam 01/28/2020  . Depression, major, single episode, moderate ([place]) 08/19/2017  . CIDP (chronic inflammatory demyelinating polyneuropathy) ([place]) 08/12/2017  . OSA (obstructive sleep apnea) 05/31/2017  . Constipation 07/29/2016  . Vitamin D deficiency 04/29/2016  . High triglycerides 04/29/2016  . Hyperglycemia 04/29/2016  . Morbid obesity ([place]) 04/22/2016  . History of melanoma 04/22/2016    Summit Borchardt Nilda Simmer PT, MPH  03/04/2020, 11:02 AM  [facility] [place], [place], [address] Phone: [phone]   Fax:  [phone]  Name: Terrance Carlson MRN: [id number] Date of Birth: 01-20-1981

## 2020-03-07 ENCOUNTER — Ambulatory Visit (INDEPENDENT_AMBULATORY_CARE_PROVIDER_SITE_OTHER): Payer: Managed Care, Other (non HMO) | Admitting: Rehabilitative and Restorative Service Providers"

## 2020-03-07 ENCOUNTER — Other Ambulatory Visit: Payer: Self-pay

## 2020-03-07 DIAGNOSIS — R29898 Other symptoms and signs involving the musculoskeletal system: Secondary | ICD-10-CM | POA: Diagnosis not present

## 2020-03-07 DIAGNOSIS — M6281 Muscle weakness (generalized): Secondary | ICD-10-CM

## 2020-03-07 DIAGNOSIS — M25531 Pain in right wrist: Secondary | ICD-10-CM | POA: Diagnosis not present

## 2020-03-07 DIAGNOSIS — M25521 Pain in right elbow: Secondary | ICD-10-CM | POA: Diagnosis not present

## 2020-03-07 NOTE — Patient Instructions (Signed)
Access Code: [email or web address] URL: [email or web address] Date: 03/07/2020 Prepared by: Margretta Ditty  Exercises Ulnar Nerve Flossing - 2 x daily - 7 x weekly - 10 reps - 1 sets Ulnar Nerve Mobilization - Low Level - 2 x daily - 7 x weekly - 10 reps - 1 sets Doorway Pec Stretch at 60 Degrees Abduction with Arm Straight - 2 x daily - 7 x weekly - 3 reps - 1 sets - 30 seconds hold Supine Elbow Extension Stretch with Weight - 2 x daily - 7 x weekly - 1 sets - 1 reps - 3 minutes hold Supine Elbow Flexion Extension AROM - 2 x daily - 7 x weekly - 1 sets - 10 reps

## 2020-03-07 NOTE — Therapy (Signed)
[place], [place], [address] Phone: [phone]   Fax:  [phone]  Physical Therapy Treatment  Patient Details  Name: Zaccary Creech MRN: [id number] Date of Birth: December 06, 1981 Referring Provider (PT): Lynne Leader, MD   Encounter Date: 03/07/2020  PT End of Session - 03/07/20 0943    Visit Number  5    Number of Visits  12    Date for PT Re-Evaluation  03/31/20    PT Start Time  0938    PT Stop Time  1020    PT Time Calculation (min)  42 min    Activity Tolerance  Patient tolerated treatment well       Past Medical History:  Diagnosis Date  . CIDP (chronic inflammatory demyelinating polyneuropathy) ([place]) 08/12/2017  . High triglycerides 04/29/2016  . History of melanoma 04/22/2016   Left abdomen status post excision in 2013.   [name] Hyperglycemia 04/29/2016  . OSA (obstructive sleep apnea) 05/31/2017    Past Surgical History:  Procedure Laterality Date  . OLECRANON BURSECTOMY Right 2017    There were no vitals filed for this visit.  Subjective Assessment - 03/07/20 0939    Subjective  The patient reported dry needling helped reduce pain.  Since he has been attending therapy, he feels pain has improved.  He was able to bowl and didn't have pain.  Exercises are going well.    Pertinent History  CIDP    Patient Stated Goals  "The main thing I'm worried about is pain."  He notes he would love to straighten it out if able.    Currently in Pain?  Yes    Pain Location  Elbow                       [facility] Adult PT Treatment/Exercise - 03/07/20 1029      Exercises   Exercises  Elbow;Wrist    Other Exercises   Supine elbow extension with 5 lb weight x 3 minute hold.  Standing arm extension into ball against wall in shoulder flexion and abduction.  Quadriped ant/posterior rocking attempting to straighten elbow *limited due to wrist extension*      Elbow Exercises   Elbow Flexion   AROM;Right;10 reps    Elbow Extension  AROM;Right;10 reps    Forearm Supination  AROM;Right;10 reps    Forearm Pronation  AROM;Right;10 reps    Other elbow exercises  Performed supine + shoulder at 90 degrees elbow flexion/extension.      Other elbow exercises  In elbow extended position loaded with 3 lb weight performed pronation/supination x 10 reps (pain in triceps region).      Iontophoresis   Type of Iontophoresis  Dexamethasone    Location  Rt distal tricep     Dose  1.0cc    Time  80 mA stat patch, 8 hour patch      Manual Therapy   Manual Therapy  Soft tissue mobilization    Soft tissue mobilization  IASTM and STM triceps and biceps R elbow             PT Education - 03/07/20 1026    Education Details  HEP progression, showed image of JAS splint and discussed indications    Person(s) Educated  Patient    Methods  Explanation;Demonstration;Handout    Comprehension  Verbalized understanding;Returned demonstration          PT Long Term Goals - 02/25/20 1319  PT LONG TERM GOAL #1   Title  The patient will be able to return demo HEP indep.    Time  6    Period  Weeks    Status  On-going      PT LONG TERM GOAL #2   Title  The patient will report functional limitation per FOTO < or equal to 30%.    Baseline  41% limitation baseline    Time  6    Period  Weeks    Status  On-going      PT LONG TERM GOAL #3   Title  The patient will report resting pain R elbow < or equal to 2/10.    Baseline  5/10    Time  6    Period  Weeks    Status  On-going      PT LONG TERM GOAL #4   Title  The patient will imrpove R elbow extension from -20 to -10 degrees.    Time  6    Period  Weeks    Status  On-going      PT LONG TERM GOAL #5   Title  The patient will improve R elbow strength to 5/5.    Baseline  4/5    Time  6    Period  Weeks    Status  On-going      PT LONG TERM GOAL #6   Title  The patient will improve R wrist extension from 10 degrees up to 20  degrees for improved use of R UE during reaching tasks.    Time  6    Period  Weeks    Status  Achieved            Plan - 03/07/20 0944    Clinical Impression Statement  The patient continues with tenderness and tightness along R triceps musculature.  We worked on loading while extending in elbow (in quadriped and standing wall leans) with fatigue tremor in muscles.  He compensates for a lack of wrist extension by flexing the elbow, which creats an abnormal loading/overuse of the triceps and biceps musculature that appears to be leading to inflammation.    Rehab Potential  Good    PT Frequency  2x / week    PT Duration  6 weeks    PT Treatment/Interventions  ADLs/Self Care Home Management;Cryotherapy;Electrical Stimulation;Iontophoresis 4mg /ml Dexamethasone;Ultrasound;Therapeutic activities;Therapeutic exercise;Neuromuscular re-education;Manual techniques;Taping;Dry needling;Passive range of motion;Patient/family education    PT Next Visit Plan  continue progressive elbow / wrist/ shoulder ROM + strengthening;  assess response to DN, STM, loading with modifications (have patient work through yoga poses with PT helping to modify), continue functional strengthening and have patient demo bowling motion (he does not plan to take time off)    PT Home Exercise Plan  Access Code: [email or web address]    Consulted and Agree with Plan of Care  Patient       Patient will benefit from skilled therapeutic intervention in order to improve the following deficits and impairments:  Decreased range of motion, Decreased strength, Increased fascial restricitons, Pain, Hypomobility, Impaired flexibility, Postural dysfunction  Visit Diagnosis: Pain in right elbow  Pain in right wrist  Muscle weakness (generalized)  Other symptoms and signs involving the musculoskeletal system     Problem List Patient Active Problem List   Diagnosis Date Noted  . Well adult exam 01/28/2020  . Depression, major, single  episode, moderate ([facility]) 08/19/2017  . 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  OSA (obstructive sleep apnea) 05/31/2017  . Constipation 07/29/2016  . Vitamin D deficiency 04/29/2016  . High triglycerides 04/29/2016  . Hyperglycemia 04/29/2016  . Morbid obesity ([facility]) 04/22/2016  . History of melanoma 04/22/2016    Harrison Zetina, PT 03/07/2020, 10:32 AM  [facility] [address] [place] [address] [place], [place], [address] Phone: [phone]   Fax:  [phone]  Name: Darryon Bastin MRN: [id number] Date of Birth: 03-23-1981

## 2020-03-10 ENCOUNTER — Encounter: Payer: Managed Care, Other (non HMO) | Admitting: Physical Therapy

## 2020-03-14 ENCOUNTER — Ambulatory Visit (INDEPENDENT_AMBULATORY_CARE_PROVIDER_SITE_OTHER): Payer: Managed Care, Other (non HMO) | Admitting: Rehabilitative and Restorative Service Providers"

## 2020-03-14 ENCOUNTER — Other Ambulatory Visit: Payer: Self-pay

## 2020-03-14 ENCOUNTER — Encounter: Payer: Self-pay | Admitting: Rehabilitative and Restorative Service Providers"

## 2020-03-14 ENCOUNTER — Telehealth: Payer: Self-pay | Admitting: Rehabilitative and Restorative Service Providers"

## 2020-03-14 DIAGNOSIS — R29898 Other symptoms and signs involving the musculoskeletal system: Secondary | ICD-10-CM | POA: Diagnosis not present

## 2020-03-14 DIAGNOSIS — M25521 Pain in right elbow: Secondary | ICD-10-CM | POA: Diagnosis not present

## 2020-03-14 DIAGNOSIS — M25531 Pain in right wrist: Secondary | ICD-10-CM

## 2020-03-14 DIAGNOSIS — M6281 Muscle weakness (generalized): Secondary | ICD-10-CM | POA: Diagnosis not present

## 2020-03-14 NOTE — Telephone Encounter (Signed)
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  He continues with R elbow pain and decreased extension.  I want to consider a Joint Active Splint (static progressive) for R Elbow extension.  To determine his insurance coverage, I would need to submit an order form and MD referral to JAS.  Can you please add a referral in Epic for R elbow pain, joint stiffness. R side elbow JAS splint  Length of Need:  3 months Indication:  Elbow Extension  I can fax your referral to [facility] to see if this is an option.  Thank you, Margretta Ditty, PT [facility] Phone [phone] Fax [phone]

## 2020-03-14 NOTE — Telephone Encounter (Signed)
Can you please add a referral in Epic for R elbow pain, joint stiffness. R side elbow JAS splint  Length of Need:  3 months Indication:  Elbow Extension  I can fax your referral to [facility] to see if this is an option.  Thank you, Margretta Ditty, PT  [facility] Phone [phone] Fax [phone]

## 2020-03-14 NOTE — Therapy (Signed)
[place], [place], [address] Phone: [phone]   Fax:  [phone]  Physical Therapy Treatment  Patient Details  Name: Terrance Carlson MRN: [id number] Date of Birth: Nov 11, 1981 Referring Provider (PT): Lynne Leader, MD   Encounter Date: 03/14/2020  PT End of Session - 03/14/20 1103    Visit Number  6    Number of Visits  12    Date for PT Re-Evaluation  03/31/20    PT Start Time  1101    PT Stop Time  2703   moist heat end of treatment   PT Time Calculation (min)  55 min    Activity Tolerance  Patient tolerated treatment well       Past Medical History:  Diagnosis Date  . CIDP (chronic inflammatory demyelinating polyneuropathy) ([place]) 08/12/2017  . High triglycerides 04/29/2016  . History of melanoma 04/22/2016   Left abdomen status post excision in 2013.   [name] Hyperglycemia 04/29/2016  . OSA (obstructive sleep apnea) 05/31/2017    Past Surgical History:  Procedure Laterality Date  . OLECRANON BURSECTOMY Right 2017    There were no vitals filed for this visit.  Subjective Assessment - 03/14/20 1103    Subjective  Pain has improved. Not awakening at night with pain anymore. Had some soreness and pain following last visit with increased stretching.    Currently in Pain?  Yes    Pain Score  0-No pain    Pain Location  Elbow    Pain Orientation  Right    Pain Descriptors / Indicators  Sore;Aching;Nagging    Pain Type  Chronic pain                       [facility] Adult PT Treatment/Exercise - 03/14/20 0001      Therapeutic Activites    Therapeutic Activities  --   myofacial ball release work Rt sidelying/ standing      Elbow Exercises   Elbow Extension  PROM;Right;5 reps   PT assist for stretch to biceps    Elbow Extension Limitations  stretch into elbow extension and wrist/finger extension     Forearm Supination  PROM;Right;5 reps      Shoulder Exercises: ROM/Strengthening   UBE (Upper Arm Bike)  L2 x 3 min 1/1/1 fwd/back/fwd       Modalities   Modalities  --   cold pack on chest during DN      Moist Heat Therapy   Number Minutes Moist Heat  12 Minutes    Moist Heat Location  Elbow   ant/post arm to shd      Manual Therapy   Manual therapy comments  skilled palpation of tissue for DN    Soft tissue mobilization  deep tissue work through Rt shoulder and arm - including biceps and triceps into forearm     Myofascial Release  Rt biceps muscle     Passive ROM  Rt elbow into ext - 2-3 reps 10-15 sec hold        Trigger Point Dry Needling - 03/14/20 0001    Consent Given?  Yes    Education Handout Provided  Previously provided    Pectoralis Major Response  Palpable increased muscle length    Pectoralis Minor Response  Palpable increased muscle length    Biceps Response  Palpable increased muscle length    Triceps Response  Palpable increased muscle length    Brachioradialis Response  Palpable increased muscle  length    Flexor carpi radialis Response  Palpable increased muscle length;Twitch response elicited    Flexor carpi ulnaris Response  Palpable increased muscle length;Twitch response elicited                PT Long Term Goals - 02/25/20 1319      PT LONG TERM GOAL #1   Title  The patient will be able to return demo HEP indep.    Time  6    Period  Weeks    Status  On-going      PT LONG TERM GOAL #2   Title  The patient will report functional limitation per FOTO < or equal to 30%.    Baseline  41% limitation baseline    Time  6    Period  Weeks    Status  On-going      PT LONG TERM GOAL #3   Title  The patient will report resting pain R elbow < or equal to 2/10.    Baseline  5/10    Time  6    Period  Weeks    Status  On-going      PT LONG TERM GOAL #4   Title  The patient will imrpove R elbow extension from -20 to -10 degrees.    Time  6    Period  Weeks    Status  On-going      PT LONG TERM GOAL #5   Title  The  patient will improve R elbow strength to 5/5.    Baseline  4/5    Time  6    Period  Weeks    Status  On-going      PT LONG TERM GOAL #6   Title  The patient will improve R wrist extension from 10 degrees up to 20 degrees for improved use of R UE during reaching tasks.    Time  6    Period  Weeks    Status  Achieved            Plan - 03/14/20 1147    Clinical Impression Statement  Good response to DN/manual work/passive stretch. Patient reports that he has less pain and is no longer awakening in the night with pain. Patient added myofacial release work with ball and massage with massage stick    Rehab Potential  Good    PT Frequency  2x / week    PT Duration  6 weeks    PT Treatment/Interventions  ADLs/Self Care Home Management;Cryotherapy;Electrical Stimulation;Iontophoresis 4mg /ml Dexamethasone;Ultrasound;Therapeutic activities;Therapeutic exercise;Neuromuscular re-education;Manual techniques;Taping;Dry needling;Passive range of motion;Patient/family education    PT Next Visit Plan  continue progressive elbow / wrist/ shoulder ROM + strengthening;  assess response to DN, STM, loading with modifications (have patient work through yoga poses with PT helping to modify), continue functional strengthening and have patient demo bowling motion (he does not plan to take time off)    PT Home Exercise Plan  Access Code: [email or web address]    Consulted and Agree with Plan of Care  Patient       Patient will benefit from skilled therapeutic intervention in order to improve the following deficits and impairments:     Visit Diagnosis: Pain in right elbow  Pain in right wrist  Muscle weakness (generalized)  Other symptoms and signs involving the musculoskeletal system     Problem List Patient Active Problem List   Diagnosis Date Noted  . Well adult exam 01/28/2020  . Depression, major, single  episode, moderate ([facility]) 08/19/2017  . CIDP (chronic inflammatory demyelinating polyneuropathy)  ([facility]) 08/12/2017  . OSA (obstructive sleep apnea) 05/31/2017  . Constipation 07/29/2016  . Vitamin D deficiency 04/29/2016  . High triglycerides 04/29/2016  . Hyperglycemia 04/29/2016  . Morbid obesity ([facility]) 04/22/2016  . History of melanoma 04/22/2016    Mosella Kasa Rober Minion PT, MPH  03/14/2020, 11:50 AM  [facility] [address] [place], [place], [address] Phone: [phone]   Fax:  [phone]  Name: Linkin Vizzini MRN: [id number] Date of Birth: [birth year]

## 2020-03-18 ENCOUNTER — Encounter: Payer: Managed Care, Other (non HMO) | Admitting: Rehabilitative and Restorative Service Providers"

## 2020-03-24 ENCOUNTER — Encounter: Payer: Managed Care, Other (non HMO) | Admitting: Rehabilitative and Restorative Service Providers"

## 2020-03-24 ENCOUNTER — Telehealth: Payer: Self-pay | Admitting: Rehabilitative and Restorative Service Providers"

## 2020-03-24 NOTE — Telephone Encounter (Signed)
Jaycub was scheduled for PT appointment today and failed to show for appointment. Called to check on pt status. There was no answer and voice mail box was full.  Jaking Thayer P. Leonor Liv PT, MPH 03/24/20 11:30 AM

## 2020-03-26 ENCOUNTER — Other Ambulatory Visit: Payer: Self-pay

## 2020-03-26 ENCOUNTER — Ambulatory Visit (INDEPENDENT_AMBULATORY_CARE_PROVIDER_SITE_OTHER): Payer: Managed Care, Other (non HMO) | Admitting: Physical Therapy

## 2020-03-26 DIAGNOSIS — M25521 Pain in right elbow: Secondary | ICD-10-CM

## 2020-03-26 DIAGNOSIS — M25531 Pain in right wrist: Secondary | ICD-10-CM | POA: Diagnosis not present

## 2020-03-26 DIAGNOSIS — M6281 Muscle weakness (generalized): Secondary | ICD-10-CM | POA: Diagnosis not present

## 2020-03-26 DIAGNOSIS — R29898 Other symptoms and signs involving the musculoskeletal system: Secondary | ICD-10-CM

## 2020-03-26 NOTE — Therapy (Signed)
[place], [place], [address] Phone: [phone]   Fax:  [phone]  Physical Therapy Treatment  Patient Details  Name: Terrance Carlson MRN: [id number] Date of Birth: [birth year] Referring Provider (PT): Lynne Leader, MD   Encounter Date: 03/26/2020  PT End of Session - 03/26/20 1542    Visit Number  7    Number of Visits  12    Date for PT Re-Evaluation  03/31/20    PT Start Time  1520    PT Stop Time  1602    PT Time Calculation (min)  42 min       Past Medical History:  Diagnosis Date  . CIDP (chronic inflammatory demyelinating polyneuropathy) ([place]) 08/12/2017  . High triglycerides 04/29/2016  . History of melanoma 04/22/2016   Left abdomen status post excision in 2013.   [name] Hyperglycemia 04/29/2016  . OSA (obstructive sleep apnea) 05/31/2017    Past Surgical History:  Procedure Laterality Date  . OLECRANON BURSECTOMY Right 2017    There were no vitals filed for this visit.  Subjective Assessment - 03/26/20 1519    Subjective  Pt stated that he is having a busy time at work. Pt had taken two weeks off of bowling and has been keeping up with his HEP per orders.  Pt state that his pain has decreased to a 2/10 and no longer wakes up in the middle of the night due to sharp shooting pains. Pt reported no functional limitaitons at work.    Currently in Pain?  No/denies    Pain Score  0-No pain    Pain Location  Elbow         [facility] PT Assessment - 03/26/20 0001      Assessment   Medical Diagnosis  M25.521 (ICD-10-CM) - Right elbow pain    Referring Provider (PT)  Lynne Leader, MD    Onset Date/Surgical Date  02/11/20    Hand Dominance  Right    Prior Therapy  none      AROM   Right Elbow Extension  -12    Right Wrist Extension  50 Degrees    Right Wrist Flexion  58 Degrees       [facility] Adult PT Treatment/Exercise - 03/26/20 0001      Self-Care   Self-Care  Other Self-Care Comments   Pt  educated in cross friction massage of R forearm (radial).     Shoulder Exercises: ROM/Strengthening   UBE (Upper Arm Bike)  L2 x 3 min 1/1/1 fwd/back/fwd       Shoulder Exercises: Stretch   Table Stretch - Flexion  2 reps;30 seconds   seated, rolling arms on ball    Other Shoulder Stretches  open book Rt rotation with 1 lb weight in right hand:: 5- 8 sec hold.x 3 reps: 2 lb weight in Rt hand 5-8 seconds, 5 reps    Other Shoulder Stretches  Rt pec stretch with arm against wall x 4 reps of 30 sec, LUE x 1 rep.   childs pose x 20 sec       Manual Therapy   Manual Therapy  Soft tissue mobilization    Soft tissue mobilization  IASTM and STM, ( including TPR and cross fiber friction) to Rt anterior shoulder, biceps and wrist flexors/ extensors to decrease fascial restrictions and improve ROM     Myofascial Release  Rt biceps muscle     Passive ROM  Rt elbow into ext - 5  reps 10-15 sec hold (PNF contract / relax)        PT Long Term Goals - 03/26/20 1720      PT LONG TERM GOAL #1   Title  The patient will be able to return demo HEP indep.    Time  6    Period  Weeks    Status  On-going      PT LONG TERM GOAL #2   Title  The patient will report functional limitation per FOTO < or equal to 30%.    Baseline  41% limitation baseline    Time  6    Period  Weeks    Status  On-going      PT LONG TERM GOAL #3   Title  The patient will report resting pain R elbow < or equal to 2/10.    Time  6    Period  Weeks    Status  Achieved      PT LONG TERM GOAL #4   Title  The patient will improve R elbow extension from -20 to -10 degrees.    Time  6    Period  Weeks    Status  On-going      PT LONG TERM GOAL #5   Title  The patient will improve R elbow strength to 5/5.    Baseline  4/5    Time  6    Period  Weeks    Status  On-going      PT LONG TERM GOAL #6   Title  The patient will improve R wrist extension from 10 degrees up to 20 degrees for improved use of R UE during reaching  tasks.    Time  6    Period  Weeks    Status  Achieved            Plan - 03/26/20 1721    Clinical Impression Statement  Pt has met LTG #3 with improved overall pain level at rest.  Pt's Rt elbow ext and wrist ext ROM has improved; near meeting ROM goal.  Pt tolerated stretches well during session.  He may benefit from additional STM to Rt wrist extensors/pronators.    Rehab Potential  Good    PT Frequency  2x / week    PT Duration  6 weeks    PT Treatment/Interventions  ADLs/Self Care Home Management;Cryotherapy;Electrical Stimulation;Iontophoresis 35m/ml Dexamethasone;Ultrasound;Therapeutic activities;Therapeutic exercise;Neuromuscular re-education;Manual techniques;Taping;Dry needling;Passive range of motion;Patient/family education    PT Next Visit Plan  continue progressive elbow / wrist/ shoulder ROM + strengthening;  assess response to DN, STM, loading with modifications (have patient work through yoga poses with PT helping to modify), continue functional strengthening and have patient demo bowling motion (he does not plan to take time off)    PT Home Exercise Plan  Access Code: [email or web address]    Consulted and Agree with Plan of Care  Patient       Patient will benefit from skilled therapeutic intervention in order to improve the following deficits and impairments:  Decreased range of motion, Decreased strength, Increased fascial restricitons, Pain, Hypomobility, Impaired flexibility, Postural dysfunction  Visit Diagnosis: Pain in right elbow  Pain in right wrist  Muscle weakness (generalized)  Other symptoms and signs involving the musculoskeletal system     Problem List Patient Active Problem List   Diagnosis Date Noted  . Well adult exam 01/28/2020  . Depression, major, single episode, moderate ([facility] 08/19/2017  . CIDP (chronic inflammatory demyelinating polyneuropathy) ([facility])  08/12/2017  . OSA (obstructive sleep apnea) 05/31/2017  . Constipation 07/29/2016  . Vitamin  D deficiency 04/29/2016  . High triglycerides 04/29/2016  . Hyperglycemia 04/29/2016  . Morbid obesity ([place]) 04/22/2016  . History of melanoma 04/22/2016   Kerin Perna, PTA 03/26/20 5:23 PM  [place], [place], [address] Phone: [phone]   Fax:  [phone]  Name: Jun Osment MRN: [id number] Date of Birth: 1981-11-09

## 2020-04-01 ENCOUNTER — Ambulatory Visit (INDEPENDENT_AMBULATORY_CARE_PROVIDER_SITE_OTHER): Payer: Managed Care, Other (non HMO) | Admitting: Rehabilitative and Restorative Service Providers"

## 2020-04-01 ENCOUNTER — Encounter: Payer: Self-pay | Admitting: Rehabilitative and Restorative Service Providers"

## 2020-04-01 ENCOUNTER — Other Ambulatory Visit: Payer: Self-pay

## 2020-04-01 DIAGNOSIS — R29898 Other symptoms and signs involving the musculoskeletal system: Secondary | ICD-10-CM

## 2020-04-01 DIAGNOSIS — M6281 Muscle weakness (generalized): Secondary | ICD-10-CM | POA: Diagnosis not present

## 2020-04-01 DIAGNOSIS — M25521 Pain in right elbow: Secondary | ICD-10-CM

## 2020-04-01 DIAGNOSIS — M25531 Pain in right wrist: Secondary | ICD-10-CM

## 2020-04-01 NOTE — Therapy (Signed)
[facility] [address] [place], [place], [address] Phone: [phone]   Fax:  [phone]  Physical Therapy Treatment  Patient Details  Name: Terrance Carlson MRN: [id number] Date of Birth: [birth year] Referring Provider (PT): [name], MD   Encounter Date: 04/01/2020  PT End of Session - 04/01/20 1009    Visit Number  8    Number of Visits  12    Date for PT Re-Evaluation  03/31/20    PT Start Time  1009    PT Stop Time  1102   MH end of treatment   PT Time Calculation (min)  53 min    Activity Tolerance  Patient tolerated treatment well       Past Medical History:  Diagnosis Date  . 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 High triglycerides 04/29/2016  . History of melanoma 04/22/2016   Left abdomen status post excision in 2013.   [name] Hyperglycemia 04/29/2016  . OSA (obstructive sleep apnea) 05/31/2017    Past Surgical History:  Procedure Laterality Date  . OLECRANON BURSECTOMY Right 2017    There were no vitals filed for this visit.  Subjective Assessment - 04/01/20 1010    Subjective  Patient reports that his arm is doing better - not as sensitive, moving better in straightening and bending; not waking him up at night. Still has limited ROM.    Currently in Pain?  No/denies         [facility] PT Assessment - 04/01/20 0001      AROM   Right Elbow Extension  -12      PROM   Right Elbow Extension  -10                   [facility] Adult PT Treatment/Exercise - 04/01/20 0001      Elbow Exercises   Other elbow exercises  worked on elbow extension with PT assist including PROM/stretch by PT and place and hold facilitating triceps work multiple reps       Shoulder Exercises: ROM/Strengthening   UBE (Upper Arm Bike)  L2 x 3 min 1/1/1 fwd/back/back       Shoulder Exercises: Stretch   Other Shoulder Stretches  Rt pec stretch with arm against wall x 4 reps of 30 sec       Moist Heat Therapy   Number Minutes Moist Heat  10 Minutes    Moist Heat Location  Elbow;Shoulder   Rt     Manual Therapy   Manual Therapy  Soft tissue mobilization    Manual therapy comments  skilled palpation of tissue for DN    Soft tissue mobilization  deep tissue work through the Rt shoulder girdle including pecs, upper trap, deltoid, biceps, triceps, extensor forearm, teres     Myofascial Release  Rt pecs - biceps     Passive ROM  Rt elbow into ext - 6-8 reps 15-30 sec hold        Trigger Point Dry Needling - 04/01/20 0001    Consent Given?  Yes    Education Handout Provided  Previously provided    Dry Needling Comments  Rt     Pectoralis Major Response  Palpable increased muscle length    Pectoralis Minor Response  Palpable increased muscle length    Deltoid Response  Palpable increased muscle length;Twitch response elicited    Teres major Response  Palpable increased muscle length    Teres minor Response  Palpable increased muscle  length    Biceps Response  Palpable increased muscle length    Triceps Response  Palpable increased muscle length    Brachioradialis Response  Palpable increased muscle length                PT Long Term Goals - 03/26/20 1720      PT LONG TERM GOAL #1   Title  The patient will be able to return demo HEP indep.    Time  6    Period  Weeks    Status  On-going      PT LONG TERM GOAL #2   Title  The patient will report functional limitation per FOTO < or equal to 30%.    Baseline  41% limitation baseline    Time  6    Period  Weeks    Status  On-going      PT LONG TERM GOAL #3   Title  The patient will report resting pain R elbow < or equal to 2/10.    Time  6    Period  Weeks    Status  Achieved      PT LONG TERM GOAL #4   Title  The patient will improve R elbow extension from -20 to -10 degrees.    Time  6    Period  Weeks    Status  On-going      PT LONG TERM GOAL #5   Title  The patient will improve R elbow  strength to 5/5.    Baseline  4/5    Time  6    Period  Weeks    Status  On-going      PT LONG TERM GOAL #6   Title  The patient will improve R wrist extension from 10 degrees up to 20 degrees for improved use of R UE during reaching tasks.    Time  6    Period  Weeks    Status  Achieved            Plan - 04/01/20 1017    Clinical Impression Statement  Patient reports continued gradual improvement. Still limited ROM Rt elbow. Awaiting contact from [employer]. DN helpful to decrease muscular tightness Rt UE.    Rehab Potential  Good    PT Frequency  2x / week    PT Duration  6 weeks    PT Treatment/Interventions  ADLs/Self Care Home Management;Cryotherapy;Electrical Stimulation;Iontophoresis 4mg /ml Dexamethasone;Ultrasound;Therapeutic activities;Therapeutic exercise;Neuromuscular re-education;Manual techniques;Taping;Dry needling;Passive range of motion;Patient/family education    PT Next Visit Plan  continue progressive elbow / wrist/ shoulder ROM + strengthening;  continue DN, STM, loading with modifications (have patient work through yoga poses with PT helping to modify), continue functional strengthening and have patient demo bowling motion (he does not plan to take time off)    PT Home Exercise Plan  Access Code: [email or web address]       Patient will benefit from skilled therapeutic intervention in order to improve the following deficits and impairments:     Visit Diagnosis: Pain in right elbow  Pain in right wrist  Muscle weakness (generalized)  Other symptoms and signs involving the musculoskeletal system     Problem List Patient Active Problem List   Diagnosis Date Noted  . Well adult exam 01/28/2020  . Depression, major, single episode, moderate ([facility]) 08/19/2017  . 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 OSA (obstructive sleep apnea) 05/31/2017  . Constipation 07/29/2016  . Vitamin D deficiency  04/29/2016  . High  triglycerides 04/29/2016  . Hyperglycemia 04/29/2016  . Morbid obesity ([place]) 04/22/2016  . History of melanoma 04/22/2016    Che Rachal Nilda Simmer PT, MPH  04/01/2020, 12:05 PM  [facility] [place], [place], [address] Phone: [phone]   Fax:  [phone]  Name: Hitoshi Werts MRN: [id number] Date of Birth: [birth year]

## 2020-04-04 ENCOUNTER — Encounter: Payer: Self-pay | Admitting: Rehabilitative and Restorative Service Providers"

## 2020-04-04 ENCOUNTER — Other Ambulatory Visit: Payer: Self-pay

## 2020-04-04 ENCOUNTER — Ambulatory Visit (INDEPENDENT_AMBULATORY_CARE_PROVIDER_SITE_OTHER): Payer: Managed Care, Other (non HMO) | Admitting: Rehabilitative and Restorative Service Providers"

## 2020-04-04 DIAGNOSIS — M6281 Muscle weakness (generalized): Secondary | ICD-10-CM

## 2020-04-04 DIAGNOSIS — M25531 Pain in right wrist: Secondary | ICD-10-CM | POA: Diagnosis not present

## 2020-04-04 DIAGNOSIS — M25521 Pain in right elbow: Secondary | ICD-10-CM | POA: Diagnosis not present

## 2020-04-04 DIAGNOSIS — R29898 Other symptoms and signs involving the musculoskeletal system: Secondary | ICD-10-CM | POA: Diagnosis not present

## 2020-04-04 NOTE — Therapy (Signed)
[facility] [address] [place], [place], [address] Phone: [phone]   Fax:  [phone]  Physical Therapy Treatment  Patient Details  Name: Terrance Carlson MRN: [id number] Date of Birth: [birth year] Referring Provider (PT): [name], MD   Encounter Date: 04/04/2020  PT End of Session - 04/04/20 1012    Visit Number  9    Number of Visits  20    Date for PT Re-Evaluation  05/16/20    PT Start Time  1012    PT Stop Time  1100   MH end of treatment   PT Time Calculation (min)  48 min    Activity Tolerance  Patient tolerated treatment well       Past Medical History:  Diagnosis Date  . 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 High triglycerides 04/29/2016  . History of melanoma 04/22/2016   Left abdomen status post excision in 2013.   [name] Hyperglycemia 04/29/2016  . OSA (obstructive sleep apnea) 05/31/2017    Past Surgical History:  Procedure Laterality Date  . OLECRANON BURSECTOMY Right 2017    There were no vitals filed for this visit.  Subjective Assessment - 04/04/20 1013    Subjective  "A little soreness but not bad" after DN last visit.Did not get to stretch as much yesterday as usual. Making progress - can tell he has more movement in the elbow and wrist.    Currently in Pain?  No/denies         [facility] PT Assessment - 04/04/20 0001      Assessment   Medical Diagnosis  M25.521 (ICD-10-CM) - Right elbow pain    Referring Provider (PT)  [name], MD    Onset Date/Surgical Date  02/11/20    Hand Dominance  Right    Prior Therapy  none      Observation/Other Assessments   Focus on Therapeutic Outcomes (FOTO)   38% limitation       AROM   Right Elbow Extension  -12      PROM   Right Elbow Extension  -10    Right Wrist Extension  60 Degrees    Right Wrist Flexion  75 Degrees      Strength   Right Elbow Flexion  --   5-/5   Right Elbow Extension  --   5-/5   Right Forearm Pronation  4+/5    Right Forearm Supination  4+/5    Right Wrist Flexion  4+/5    Right Wrist Extension  4+/5      Palpation   Palpation comment  conitnued muscular tightness through the Rt triceps/biceps/pecs/teres/ extensor forearm                    [facility] Adult PT Treatment/Exercise - 04/04/20 0001      Shoulder Exercises: ROM/Strengthening   UBE (Upper Arm Bike)  L3 x 3 min 1/1/1 fwd/back/back       Shoulder Exercises: Stretch   Other Shoulder Stretches  Rt pec stretch with arm against wall x 4 reps of 30 sec      Moist Heat Therapy   Number Minutes Moist Heat  10 Minutes    Moist Heat Location  Elbow;Shoulder   Rt     Manual Therapy   Manual Therapy  Soft tissue mobilization    Joint Mobilization  GH joint; elbow     Soft tissue mobilization  deep tissue work through the Rt shoulder girdle including  pecs, upper trap, deltoid, biceps, triceps, extensor forearm, teres     Myofascial Release  Rt pecs - biceps     Scapular Mobilization  Rt in sidelying     Passive ROM  Rt elbow into ext - 6-8 reps 15-30 sec hold; PROM Rt shoulder into abduction, rotation; extension      Manual Traction  through long arm                   PT Long Term Goals - 04/04/20 1024      PT LONG TERM GOAL #1   Title  The patient will be able to return demo HEP indep.    Time  6    Period  Weeks    Status  Achieved      PT LONG TERM GOAL #2   Title  The patient will report functional limitation per FOTO < or equal to 30%.    Baseline  38% limitation    Time  12    Period  Weeks    Status  Revised    Target Date  05/16/20      PT LONG TERM GOAL #3   Title  The patient will report resting pain R elbow < or equal to 2/10.    Baseline  0/10 resting    Time  6    Period  Weeks    Status  Achieved      PT LONG TERM GOAL #4   Title  The patient will improve R elbow extension from -20 to -10 degrees.    Time  12    Period  Weeks    Status  Revised     Target Date  05/16/20      PT LONG TERM GOAL #5   Title  The patient will improve R elbow strength to 5/5.    Baseline  4+/5    Time  12    Period  Weeks    Status  Revised    Target Date  05/16/20      PT LONG TERM GOAL #6   Title  The patient will improve R wrist extension from 10 degrees up to 20 degrees for improved use of R UE during reaching tasks.    Status  Achieved            Plan - 04/04/20 1102    Clinical Impression Statement  Cotninued gradual progress with ROM Rt elbow. Minimal pain and patient is now sleeping without awakening due to pain. Extensive manual work and PROM Rt upper quarter today with good response. Patient is working well toward stated goals of therapy. FOTO score improved to 38% limitation.  Will benefit from continued PT to achieve goals and reach maximum rehab potential.    Rehab Potential  Good    PT Frequency  2x / week    PT Duration  12 weeks    PT Treatment/Interventions  ADLs/Self Care Home Management;Cryotherapy;Electrical Stimulation;Iontophoresis 4mg /ml Dexamethasone;Ultrasound;Therapeutic activities;Therapeutic exercise;Neuromuscular re-education;Manual techniques;Taping;Dry needling;Passive range of motion;Patient/family education    PT Next Visit Plan  continue progressive elbow / wrist/ shoulder ROM + strengthening;  continue DN, STM, loading with modifications (have patient work through yoga poses with PT helping to modify), continue functional strengthening and have patient demo bowling motion (he does not plan to take time off)    PT Home Exercise Plan  Access Code: [email or web address]    Consulted and Agree with Plan of Care  Patient  Patient will benefit from skilled therapeutic intervention in order to improve the following deficits and impairments:     Visit Diagnosis: Pain in right elbow - Plan: PT plan of care cert/re-cert  Pain in right wrist - Plan: PT plan of care cert/re-cert  Muscle weakness (generalized) - Plan: PT plan of  care cert/re-cert  Other symptoms and signs involving the musculoskeletal system - Plan: PT plan of care cert/re-cert     Problem List Patient Active Problem List   Diagnosis Date Noted  . Well adult exam 01/28/2020  . Depression, major, single episode, moderate ([place]) 08/19/2017  . CIDP (chronic inflammatory demyelinating polyneuropathy) ([place]) 08/12/2017  . OSA (obstructive sleep apnea) 05/31/2017  . Constipation 07/29/2016  . Vitamin D deficiency 04/29/2016  . High triglycerides 04/29/2016  . Hyperglycemia 04/29/2016  . Morbid obesity ([place]) 04/22/2016  . History of melanoma 04/22/2016    Terrance Carlson Nilda Simmer PT, MPH  04/04/2020, 11:05 AM  [facility] [place], [place], [address] Phone: [phone]   Fax:  [phone]  Name: Terrance Carlson MRN: [id number] Date of Birth: Oct 25, 1981

## 2020-04-08 ENCOUNTER — Encounter: Payer: Managed Care, Other (non HMO) | Admitting: Rehabilitative and Restorative Service Providers"

## 2020-04-08 ENCOUNTER — Encounter: Payer: Self-pay | Admitting: Rehabilitative and Restorative Service Providers"

## 2020-04-10 ENCOUNTER — Encounter: Payer: Managed Care, Other (non HMO) | Admitting: Physical Therapy

## 2020-04-16 ENCOUNTER — Encounter: Payer: Self-pay | Admitting: Physical Therapy

## 2020-04-16 ENCOUNTER — Other Ambulatory Visit: Payer: Self-pay

## 2020-04-16 ENCOUNTER — Ambulatory Visit (INDEPENDENT_AMBULATORY_CARE_PROVIDER_SITE_OTHER): Payer: Managed Care, Other (non HMO) | Admitting: Physical Therapy

## 2020-04-16 DIAGNOSIS — R29898 Other symptoms and signs involving the musculoskeletal system: Secondary | ICD-10-CM

## 2020-04-16 DIAGNOSIS — M25521 Pain in right elbow: Secondary | ICD-10-CM

## 2020-04-16 DIAGNOSIS — M25531 Pain in right wrist: Secondary | ICD-10-CM

## 2020-04-16 DIAGNOSIS — M6281 Muscle weakness (generalized): Secondary | ICD-10-CM

## 2020-04-16 NOTE — Therapy (Signed)
[facility] [place] [address] [place], [place], [address] Phone: [phone]   Fax:  [phone]  Physical Therapy Treatment  Patient Details  Name: Terrance Carlson MRN: [id number] Date of Birth: 10-28-1981 Referring Provider (PT): [name], MD   Encounter Date: 04/16/2020  PT End of Session - 04/16/20 1059    Visit Number  10    Number of Visits  20    Date for PT Re-Evaluation  05/16/20    PT Start Time  1100    PT Stop Time  1140    PT Time Calculation (min)  40 min    Activity Tolerance  Patient tolerated treatment well    Behavior During Therapy  [facility] [place] [facility] for tasks assessed/performed       Past Medical History:  Diagnosis Date  . 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 High triglycerides 04/29/2016  . History of melanoma 04/22/2016   Left abdomen status post excision in 2013.   [name] Hyperglycemia 04/29/2016  . OSA (obstructive sleep apnea) 05/31/2017    Past Surgical History:  Procedure Laterality Date  . OLECRANON BURSECTOMY Right 2017    There were no vitals filed for this visit.  Subjective Assessment - 04/16/20 1216    Subjective  Pt feels his elbow is getting straighter.  He is still awaiting a call from [employer] splint rep.  He now has a massage gun at home; has used it some on Rt upper quarter.  He feels his HEP is comprehensive and working well.    Pertinent History  CIDP    Patient Stated Goals  He notes he would love to straighten it out if able.    Currently in Pain?  No/denies    Pain Score  0-No pain         [facility] PT Assessment - 04/16/20 0001      Assessment   Medical Diagnosis  M25.521 (ICD-10-CM) - Right elbow pain    Referring Provider (PT)  [name], MD    Onset Date/Surgical Date  02/11/20    Hand Dominance  Right    Prior Therapy  none      AROM   Right Elbow Flexion  131    Right Elbow Extension  -15       [facility] Adult PT Treatment/Exercise - 04/16/20  0001      Shoulder Exercises: Stretch   Other Shoulder Stretches  open book with Rt rotation, holding 2# wt x 5-10 sec hold x 10 reps;  puppy pose with blocks (hands in prayer behind head, childs pose with elbows on blocks x 15 sec      Other Shoulder Stretches  Rt pec stretch with arm against wall at various angles for review (x 5-10 sec each)       Manual Therapy   Manual therapy comments  Rt in sidelying and supine     Soft tissue mobilization  deep tissue through Rt tricep, lat, teres major, Rt wrist extensors; Rt extensor carpi ulnaris     Myofascial Release  Rt bicep, ant and lateral deltoid and teres major.      Passive ROM  Rt elbow into ext - 6-8 reps 15-30 sec hold;  Rt shoulder flexion with scapula pinned. Rt forearm supination/pronation        PT Long Term Goals - 04/04/20 1024      PT LONG TERM GOAL #1   Title  The patient will be able to return demo HEP indep.  Time  6    Period  Weeks    Status  Achieved      PT LONG TERM GOAL #2   Title  The patient will report functional limitation per FOTO < or equal to 30%.    Baseline  38% limitation    Time  12    Period  Weeks    Status  Revised    Target Date  05/16/20      PT LONG TERM GOAL #3   Title  The patient will report resting pain R elbow < or equal to 2/10.    Baseline  0/10 resting    Time  6    Period  Weeks    Status  Achieved      PT LONG TERM GOAL #4   Title  The patient will improve R elbow extension from -20 to -10 degrees.    Time  12    Period  Weeks    Status  Revised    Target Date  05/16/20      PT LONG TERM GOAL #5   Title  The patient will improve R elbow strength to 5/5.    Baseline  4+/5    Time  12    Period  Weeks    Status  Revised    Target Date  05/16/20      PT LONG TERM GOAL #6   Title  The patient will improve R wrist extension from 10 degrees up to 20 degrees for improved use of R UE during reaching tasks.    Status  Achieved            Plan - 04/16/20 1213     Clinical Impression Statement  Extensive work to Rt forearm, upper arm and shoulder with good response.  Pt had some tender areas of tightness in Rt lateral distal tricep, Rt teres major, Rt extensor carpi ulnaris; improved with TPR/ STM to these areas.  Pt has made good progress towards LTGs; progressing well towards remaining ROM goal.    Comorbidities  h/o olecranon bursectomy, h/o CIDP    Rehab Potential  Good    PT Frequency  2x / week    PT Duration  12 weeks    PT Treatment/Interventions  ADLs/Self Care Home Management;Cryotherapy;Electrical Stimulation;Iontophoresis 4mg /ml Dexamethasone;Ultrasound;Therapeutic activities;Therapeutic exercise;Neuromuscular re-education;Manual techniques;Taping;Dry needling;Passive range of motion;Patient/family education    PT Next Visit Plan  continue DN/STM/PROM.  (pt has good HEP for home)    PT Home Exercise Plan  Access Code: [email or web address]    Consulted and Agree with Plan of Care  Patient       Patient will benefit from skilled therapeutic intervention in order to improve the following deficits and impairments:  Decreased range of motion, Decreased strength, Increased fascial restricitons, Pain, Hypomobility, Impaired flexibility, Postural dysfunction  Visit Diagnosis: Pain in right elbow  Pain in right wrist  Muscle weakness (generalized)  Other symptoms and signs involving the musculoskeletal system     Problem List Patient Active Problem List   Diagnosis Date Noted  . Well adult exam 01/28/2020  . Depression, major, single episode, moderate ([place]) 08/19/2017  . CIDP (chronic inflammatory demyelinating polyneuropathy) ([place]) 08/12/2017  . OSA (obstructive sleep apnea) 05/31/2017  . Constipation 07/29/2016  . Vitamin D deficiency 04/29/2016  . High triglycerides 04/29/2016  . Hyperglycemia 04/29/2016  . Morbid obesity ([place]) 04/22/2016  . History of melanoma 04/22/2016   Kerin Perna, PTA 04/16/20 12:22 PM  [facility] [address] [place], [place], [address] Phone: [phone]   Fax:  [phone]  Name: Shloima Clinch MRN: [id number] Date of Birth: 03/25/1981

## 2020-04-18 ENCOUNTER — Encounter: Payer: Self-pay | Admitting: Rehabilitative and Restorative Service Providers"

## 2020-04-23 ENCOUNTER — Encounter: Payer: Self-pay | Admitting: Physical Therapy

## 2020-04-23 ENCOUNTER — Ambulatory Visit (INDEPENDENT_AMBULATORY_CARE_PROVIDER_SITE_OTHER): Payer: Managed Care, Other (non HMO) | Admitting: Physical Therapy

## 2020-04-23 ENCOUNTER — Other Ambulatory Visit: Payer: Self-pay

## 2020-04-23 DIAGNOSIS — M25521 Pain in right elbow: Secondary | ICD-10-CM

## 2020-04-23 DIAGNOSIS — M25531 Pain in right wrist: Secondary | ICD-10-CM | POA: Diagnosis not present

## 2020-04-23 DIAGNOSIS — M6281 Muscle weakness (generalized): Secondary | ICD-10-CM | POA: Diagnosis not present

## 2020-04-23 DIAGNOSIS — R29898 Other symptoms and signs involving the musculoskeletal system: Secondary | ICD-10-CM | POA: Diagnosis not present

## 2020-04-23 NOTE — Therapy (Addendum)
[facility] [place] [facility] [place], [place], [address] Phone: [phone]   Fax:  [phone]  Physical Therapy Treatment and Discharge Summary  Patient Details  Name: Terrance Carlson MRN: [id number] Date of Birth: 1981-01-09 Referring Provider (PT): Lynne Leader, MD   Encounter Date: 04/23/2020  PT End of Session - 04/23/20 1109    Visit Number  11    Number of Visits  20    Date for PT Re-Evaluation  05/16/20    PT Start Time  1104    PT Stop Time  1140    PT Time Calculation (min)  36 min    Activity Tolerance  Patient tolerated treatment well    Behavior During Therapy  [facility] for tasks assessed/performed       Past Medical History:  Diagnosis Date  . CIDP (chronic inflammatory demyelinating polyneuropathy) ([place]) 08/12/2017  . High triglycerides 04/29/2016  . History of melanoma 04/22/2016   Left abdomen status post excision in 2013.   [name] Hyperglycemia 04/29/2016  . OSA (obstructive sleep apnea) 05/31/2017    Past Surgical History:  Procedure Laterality Date  . OLECRANON BURSECTOMY Right 2017    There were no vitals filed for this visit.  Subjective Assessment - 04/23/20 1109    Subjective  Pt reports he was painfree after last session.  He worked on his patio (lifting pavers up to 50#), without difficulty. However, he reports increased pain in the Rt elbow in the days that followed up to 7/10.    Pertinent History  CIDP    Patient Stated Goals  pt would love to straighten it out if able.    Currently in Pain?  No/denies    Pain Score  0-No pain         [facility] PT Assessment - 04/23/20 0001      Assessment   Medical Diagnosis  M25.521 (ICD-10-CM) - Right elbow pain    Referring Provider (PT)  Lynne Leader, MD    Onset Date/Surgical Date  02/11/20    Hand Dominance  Right    Prior Therapy  none      AROM   Right Elbow Flexion  138    Right Elbow Extension  -12      [facility] Adult PT Treatment/Exercise - 04/23/20  0001      Shoulder Exercises: ROM/Strengthening   UBE (Upper Arm Bike)  L3 x 2 min 1/1 fwd/back/back       Shoulder Exercises: Stretch   Other Shoulder Stretches  puppy pose with blocks (hands in prayer behind head, childs pose with elbows on blocks x 30 sec        Manual Therapy   Manual therapy comments  I strip of sensitive skin Rock tape applied with 25% stretch to distal tricep (at area of discomfort) and another strip with 10% stretch over area of bruise at Rt proximal bicep.     Soft tissue mobilization  IASTM to Rt anconeus, tricep, teres major, wrist flexors and extensors, bicep, brachioradialis;  deep STM to same areas to decrease fascial restrictions and improve ROM.     Myofascial Release  Rt bicep and tricep    Passive ROM  Rt elbow into ext - 5 reps 15-30 sec hold                  PT Long Term Goals - 04/04/20 1024      PT LONG TERM GOAL #1   Title  The patient will be  able to return demo HEP indep.    Time  6    Period  Weeks    Status  Achieved      PT LONG TERM GOAL #2   Title  The patient will report functional limitation per FOTO < or equal to 30%.    Baseline  38% limitation    Time  12    Period  Weeks    Status  Revised    Target Date  05/16/20      PT LONG TERM GOAL #3   Title  The patient will report resting pain R elbow < or equal to 2/10.    Baseline  0/10 resting    Time  6    Period  Weeks    Status  Achieved      PT LONG TERM GOAL #4   Title  The patient will improve R elbow extension from -20 to -10 degrees.    Time  12    Period  Weeks    Status  Revised    Target Date  05/16/20      PT LONG TERM GOAL #5   Title  The patient will improve R elbow strength to 5/5.    Baseline  4+/5    Time  12    Period  Weeks    Status  Revised    Target Date  05/16/20      PT LONG TERM GOAL #6   Title  The patient will improve R wrist extension from 10 degrees up to 20 degrees for improved use of R UE during reaching tasks.    Status   Achieved            Plan - 04/23/20 1255    Clinical Impression Statement  Pt had flare up of pain after heavy lifting with outdoor work on stone patio, but pain has since subsided  with continued self care and stretches with HEP.  Continued extensive manual work to Rt forearm and elbow musculature with good response.  Pt's Rt elbow ROM continues to improve; not quite to extension goal.    Comorbidities  h/o olecranon bursectomy, h/o CIDP    Rehab Potential  Good    PT Frequency  2x / week    PT Duration  12 weeks    PT Treatment/Interventions  ADLs/Self Care Home Management;Cryotherapy;Electrical Stimulation;Iontophoresis 27m/ml Dexamethasone;Ultrasound;Therapeutic activities;Therapeutic exercise;Neuromuscular re-education;Manual techniques;Taping;Dry needling;Passive range of motion;Patient/family education    PT Next Visit Plan  continue DN/STM/PROM.  (pt has good HEP for home)    PT Home Exercise Plan  Access Code: [email or web address]   Consulted and Agree with Plan of Care  Patient       Patient will benefit from skilled therapeutic intervention in order to improve the following deficits and impairments:  Decreased range of motion, Decreased strength, Increased fascial restricitons, Pain, Hypomobility, Impaired flexibility, Postural dysfunction  Visit Diagnosis: Pain in right elbow  Pain in right wrist  Muscle weakness (generalized)  Other symptoms and signs involving the musculoskeletal system    PHYSICAL THERAPY DISCHARGE SUMMARY  Visits from Start of Care: 11  Current functional level related to goals / functional outcomes: Patient partially met LTGs.     Remaining deficits: He continues with pain with overuse.     Education / Equipment: Home program, activity modifications.  Plan: Patient agrees to discharge.  Patient goals were partially met. Patient is being discharged due to not returning since the last visit.  ?????  Thank you for the referral of this  patient. Rudell Cobb, MPT  Kerin Perna, [place] 04/23/20 1:07 PM  [facility] [place], [place], [address] Phone: [phone]   Fax:  [phone]  Name: Terrance Carlson MRN: [id number] Date of Birth: Mar 29, 1981

## 2020-05-05 ENCOUNTER — Encounter: Payer: Managed Care, Other (non HMO) | Admitting: Rehabilitative and Restorative Service Providers"

## 2020-05-05 ENCOUNTER — Encounter: Payer: Self-pay | Admitting: Rehabilitative and Restorative Service Providers"

## 2020-12-25 ENCOUNTER — Encounter: Payer: Self-pay | Admitting: Family Medicine

## 2020-12-29 ENCOUNTER — Encounter: Payer: Self-pay | Admitting: Family Medicine

## 2020-12-30 ENCOUNTER — Telehealth (INDEPENDENT_AMBULATORY_CARE_PROVIDER_SITE_OTHER): Payer: Managed Care, Other (non HMO) | Admitting: Family Medicine

## 2020-12-30 ENCOUNTER — Encounter: Payer: Self-pay | Admitting: Family Medicine

## 2020-12-30 VITALS — Wt 250.0 lb

## 2020-12-30 DIAGNOSIS — Z20822 Contact with and (suspected) exposure to covid-19: Secondary | ICD-10-CM | POA: Diagnosis not present

## 2020-12-30 LAB — POCT INFLUENZA A/B
Influenza A, POC: NEGATIVE
Influenza B, POC: NEGATIVE

## 2020-12-30 NOTE — Assessment & Plan Note (Signed)
His symptoms remain suspicious for COVID and/or influenza like illness. He will stop by the clinic to be re-tested for COVID.  Will check flu swab as well.  Continue supportive care with increased fluid intake and OTC medications as needed  comfort and symptom control.

## 2020-12-30 NOTE — Progress Notes (Signed)
Terrance Carlson - 40 y.o. male MRN [id number]  Date of birth: 07/20/1981   This visit type was conducted due to national recommendations for restrictions regarding the COVID-19 Pandemic (e.g. social distancing).  This format is felt to be most appropriate for this patient at this time.  All issues noted in this document were discussed and addressed.  No physical exam was performed (except for noted visual exam findings with Video Visits).  I discussed the limitations of evaluation and management by telemedicine and the availability of in person appointments. The patient expressed understanding and agreed to proceed.  I connected with@ on 12/30/20 at  3:00 PM EST by a video enabled telemedicine application and verified that I am speaking with the correct person using two identifiers.  Present at visit: Everrett Coombe, DO Terrance Carlson   Patient Location: Home [address] [place]  [place] [address]   Provider location:   [facility]  No chief complaint on file.   HPI  [name] is a 40 y.o. male who presents via audio/video conferencing for a telehealth visit today.  He has complaint of body aches, congestion, diarrhea, headach,cough and sore throat.  Symptoms started 2 days ago.  Had PCR test yesterday which returned negative today.  Only had mild symptoms when testing.  Symptoms worsened today.  He has not had COVID or Flu vaccine.  Unable to get flu vaccine and only recently given the green light to get COVID vaccine by his neurologist.  He denies shortness of breath, nausea or vomiting, or difficulty with eating/drinking.    ROS:  A comprehensive ROS was completed and negative except as noted per HPI  Past Medical History:  Diagnosis Date  . 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 High triglycerides 04/29/2016  . History of melanoma 04/22/2016   Left abdomen status post excision in 2013.   [name] Hyperglycemia 04/29/2016  . OSA (obstructive sleep apnea)  05/31/2017    Past Surgical History:  Procedure Laterality Date  . OLECRANON BURSECTOMY Right 2017    History reviewed. No pertinent family history.  Social History   Socioeconomic History  . Marital status: Married    Spouse name: Not on file  . Number of children: Not on file  . Years of education: Not on file  . Highest education level: Not on file  Occupational History  . Not on file  Tobacco Use  . Smoking status: Former Smoker    Types: Cigarettes  . Smokeless tobacco: Never Used  Substance and Sexual Activity  . Alcohol use: Not Currently    Alcohol/week: 0.0 Carlson drinks  . Drug use: Never  . Sexual activity: Yes    Partners: Female  Other Topics Concern  . Not on file  Social History Narrative  . Not on file   Social Determinants of Health   Financial Resource Strain: Not on file  Food Insecurity: Not on file  Transportation Needs: Not on file  Physical Activity: Not on file  Stress: Not on file  Social Connections: Not on file  Intimate Partner Violence: Not on file     Current Outpatient Medications:  .  Cholecalciferol (VITAMIN D3) 5000 UNIT/ML LIQD, Take by mouth., Disp: , Rfl:  .  gabapentin (NEURONTIN) 300 MG capsule, Take 600 mg by mouth 3 (three) times daily., Disp: , Rfl:  .  Omega-3 Fatty Acids (FISH OIL) 1000 MG CAPS, Take 1,000 mg by mouth daily., Disp: , Rfl:  .  vitamin B-12 (CYANOCOBALAMIN) 500  MCG tablet, Take 1 tablet by mouth daily., Disp: , Rfl:   EXAM:  VITALS per patient if applicable: Wt 250 lb (113.4 kg)   BMI 33.91 kg/m   GENERAL: alert, oriented, appears well and in no acute distress  HEENT: atraumatic, conjunttiva clear, no obvious abnormalities on inspection of external nose and ears  NECK: normal movements of the head and neck  LUNGS: on inspection no signs of respiratory distress, breathing rate appears normal, no obvious gross SOB, gasping or wheezing  CV: no obvious cyanosis  MS: moves all visible  extremities without noticeable abnormality  PSYCH/NEURO: pleasant and cooperative, no obvious depression or anxiety, speech and thought processing grossly intact  ASSESSMENT AND PLAN:  Discussed the following assessment and plan:  Suspected COVID-19 virus infection His symptoms remain suspicious for COVID and/or influenza like illness. He will stop by the clinic to be re-tested for COVID.  Will check flu swab as well.  Continue supportive care with increased fluid intake and OTC medications as needed  comfort and symptom control.      I discussed the assessment and treatment plan with the patient. The patient was provided an opportunity to ask questions and all were answered. The patient agreed with the plan and demonstrated an understanding of the instructions.   The patient was advised to call back or seek an in-person evaluation if the symptoms worsen or if the condition fails to improve as anticipated.    Everrett Coombe, DO

## 2020-12-30 NOTE — Progress Notes (Signed)
[id number] Symptoms started Sunday night.  PCR at [facility] in [place]. Negative result today  Body aches Diarrhea Cough Congestion Sore throat Headache  Meds: Mucinex, ibuprofen, vitamins, orange juice, vitamin c packets.

## 2020-12-31 ENCOUNTER — Encounter: Payer: Self-pay | Admitting: Family Medicine

## 2021-01-01 ENCOUNTER — Encounter: Payer: Self-pay | Admitting: Family Medicine

## 2021-01-01 LAB — COVID-19, FLU A+B NAA
Influenza A, NAA: NOT DETECTED
Influenza B, NAA: NOT DETECTED
SARS-CoV-2, NAA: NOT DETECTED

## 2022-02-17 ENCOUNTER — Encounter: Payer: Self-pay | Admitting: Family Medicine

## 2022-02-17 ENCOUNTER — Other Ambulatory Visit: Payer: Self-pay

## 2022-02-17 ENCOUNTER — Ambulatory Visit (INDEPENDENT_AMBULATORY_CARE_PROVIDER_SITE_OTHER): Payer: Managed Care, Other (non HMO) | Admitting: Family Medicine

## 2022-02-17 VITALS — BP 118/79 | HR 76 | Ht 70.0 in | Wt 270.0 lb

## 2022-02-17 DIAGNOSIS — S81812A Laceration without foreign body, left lower leg, initial encounter: Secondary | ICD-10-CM | POA: Insufficient documentation

## 2022-02-17 DIAGNOSIS — S81812D Laceration without foreign body, left lower leg, subsequent encounter: Secondary | ICD-10-CM | POA: Diagnosis not present

## 2022-02-17 MED ORDER — DOXYCYCLINE HYCLATE 100 MG PO TABS: 100.0000 mg | ORAL_TABLET | Freq: Two times a day (BID) | ORAL | 0 refills | Status: DC

## 2022-02-17 MED ORDER — DOXYCYCLINE HYCLATE 100 MG PO TABS: 100.0000 mg | ORAL_TABLET | Freq: Two times a day (BID) | ORAL | 0 refills | Status: AC

## 2022-02-17 NOTE — Assessment & Plan Note (Addendum)
Sutures removed today.  Steri-Strips applied after removal of sutures.  There does appear to be some surrounding cellulitis.  Adding doxycycline.  Instructed to follow-up if symptoms or not improvin symptoms are resolving. ?

## 2022-02-17 NOTE — Progress Notes (Signed)
?Terrance Carlson - 41 y.o. male MRN [id number]  Date of birth: Jul 31, 1981 ? ?Subjective ?Chief Complaint  ?Patient presents with  ? Acute Visit  ? ? ?HPI ?Othal is a 41 year old male here today for follow-up of recent laceration.  Reports that he hit his leg on a bed frame a couple weeks ago resulting in laceration to the left lower leg.  He was seen in urgent care and had repair with sutures.  He was not placed on any antibiotics.  He is here today for suture removal.  He has had some increased pain and erythema around the wound.  Denies any bleeding or drainage.  He has not had fever. ? ?ROS:  A comprehensive ROS was completed and negative except as noted per HPI ? ?Allergies  ?Allergen Reactions  ? Influenza Vaccines Other (See Comments)  ?  Gianne Barre Syndrome  ? Other Other (See Comments)  ?  Patient is allergic to the Flu Shot.  ? ? ?Past Medical History:  ?Diagnosis Date  ? 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 High triglycerides 04/29/2016  ? History of melanoma 04/22/2016  ? Left abdomen status post excision in 2013.   ? Hyperglycemia 04/29/2016  ? OSA (obstructive sleep apnea) 05/31/2017  ? ? ?Past Surgical History:  ?Procedure Laterality Date  ? OLECRANON BURSECTOMY Right 2017  ? ? ?Social History  ? ?Socioeconomic History  ? Marital status: Married  ?  Spouse name: Not on file  ? Number of children: Not on file  ? Years of education: Not on file  ? Highest education level: Not on file  ?Occupational History  ? Not on file  ?Tobacco Use  ? Smoking status: Former  ?  Types: Cigarettes  ? Smokeless tobacco: Never  ?Substance and Sexual Activity  ? Alcohol use: Not Currently  ?  Alcohol/week: 0.0 standard drinks  ? Drug use: Never  ? Sexual activity: Yes  ?  Partners: Female  ?Other Topics Concern  ? Not on file  ?Social History Narrative  ? Not on file  ? ?Social Determinants of Health  ? ?Financial Resource Strain: Not on file  ?Food Insecurity: Not on file  ?Transportation  Needs: Not on file  ?Physical Activity: Not on file  ?Stress: Not on file  ?Social Connections: Not on file  ? ? ?History reviewed. No pertinent family history. ? ?Health Maintenance  ?Topic Date Due  ? COVID-19 Vaccine (1) Never done  ? Hepatitis C Screening  Never done  ? TETANUS/TDAP  01/27/2030  ? HIV Screening  Completed  ? HPV VACCINES  Aged Out  ? ? ? ?----------------------------------------------------------------------------------------------------------------------------------------------------------------------------------------------------------------- ?Physical Exam ?BP 118/79   Pulse 76   Ht 5\' 10"  (1.778 m)   Wt 270 lb (122.5 kg)   SpO2 96%   BMI 38.74 kg/m?  ? ?Physical Exam ?Constitutional:   ?   Appearance: Normal appearance.  ?Skin: ?   Comments: Laceration to left lower extremity with suture repair.  There is surrounding erythema with mild healing.  No signs of abscess.  No significant dehiscence noted. ? ?17 sutures removed without difficulty.  ?Neurological:  ?   Mental Status: He is alert.  ? ? ?------------------------------------------------------------------------------------------------------------------------------------------------------------------------------------------------------------------- ?Assessment and Plan ? ?Laceration of left lower extremity ?Sutures removed today.  Steri-Strips applied after removal of sutures.  There does appear to be some surrounding cellulitis.  Adding doxycycline.  Instructed to follow-up if symptoms or not improvin symptoms are resolving. ? ? ?Meds ordered this encounter  ?Medications  ?  DISCONTD: doxycycline (VIBRA-TABS) 100 MG tablet  ?  Sig: Take 1 tablet (100 mg total) by mouth 2 (two) times daily for 10 days.  ?  Dispense:  20 tablet  ?  Refill:  0  ? doxycycline (VIBRA-TABS) 100 MG tablet  ?  Sig: Take 1 tablet (100 mg total) by mouth 2 (two) times daily for 10 days.  ?  Dispense:  20 tablet  ?  Refill:  0  ? ? ?No follow-ups on  file. ? ? ? ?This visit occurred during the SARS-CoV-2 public health emergency.  Safety protocols were in place, including screening questions prior to the visit, additional usage of staff PPE, and extensive cleaning of exam room while observing appropriate contact time as indicated for disinfecting solutions.  ? ?

## 2022-03-23 ENCOUNTER — Telehealth: Payer: Self-pay

## 2022-03-23 NOTE — Telephone Encounter (Signed)
Patient scheduled for 03/24/2022! ?

## 2022-03-23 NOTE — Telephone Encounter (Signed)
Please contact pt to schedule appt for leg issue. Explain Dr. Zigmund Daniel is currently unavailable. Thank you  ?

## 2022-03-24 ENCOUNTER — Encounter: Payer: Self-pay | Admitting: Medical-Surgical

## 2022-03-24 ENCOUNTER — Ambulatory Visit (INDEPENDENT_AMBULATORY_CARE_PROVIDER_SITE_OTHER): Payer: Managed Care, Other (non HMO) | Admitting: Medical-Surgical

## 2022-03-24 VITALS — BP 117/73 | HR 66 | Resp 20 | Ht 70.0 in | Wt 272.7 lb

## 2022-03-24 DIAGNOSIS — L97912 Non-pressure chronic ulcer of unspecified part of right lower leg with fat layer exposed: Secondary | ICD-10-CM | POA: Diagnosis not present

## 2022-03-24 MED ORDER — SULFAMETHOXAZOLE-TRIMETHOPRIM 800-160 MG PO TABS: 1.0000 | ORAL_TABLET | Freq: Two times a day (BID) | ORAL | 0 refills | Status: DC

## 2022-03-24 NOTE — Progress Notes (Signed)
?  HPI with pertinent ROS:  ? ?CC: right leg laceration follow up ? ?HPI: ?Pleasant 41 year old male presenting today for reevaluation of his right leg laceration.  He was seen on 215 at urgent care for laceration of the right lower extremity where they placed 17 sutures.  On 3/1 he saw his PCP for removal of the sutures and there was some cellulitic changes to the area.  He was prescribed doxycycline which he completed as prescribed.  Today he returns with concerns for the possible need for further antibiotics due to the appearance of the wound and his immune concerns.  No fevers but he has seen some purulent drainage from the wound.  Keeping it covered during the day however he does let it air out at night.  Notes the scab fell off and it has been open since then.  Has been using bacitracin topically and cleaning with peroxide. ? ?I reviewed the past medical history, family history, social history, surgical history, and allergies today and no changes were needed.  Please see the problem list section below in epic for further details. ? ? ?Physical exam:  ? ?General: Well Developed, well nourished, and in no acute distress.  ?Neuro: Alert and oriented x3.  ?HEENT: Normocephalic, atraumatic.  ?Skin: Warm and dry.  See clinical photo. ?Cardiac: Regular rate and rhythm, no murmurs rubs or gallops, no lower extremity edema.  ?Respiratory: Clear to auscultation bilaterally. Not using accessory muscles, speaking in full sentences. ? ? ? ? ?Impression and Recommendations:   ? ?1. Ulcer of right lower extremity with fat layer exposed (HCC) ?Open wound with fat layer exposed with surrounding erythema swelling and tenderness.  He has already completed doxycycline however he does still have cellulitic changes that are concerning along with purulent drainage.  We will go ahead and treat with Bactrim twice daily x7 days.  Wet-to-dry normal saline dressing applied with Vaseline to wound edges to prevent maceration.  Secured in  place and advised to change this every 12 hours.  Return in 1 week to see his PCP (or me) for wound care follow-up.  Reviewed instructions for dressing changes with patient and provided written instructions.  He should wash the wound once daily with warm soapy water, rinse well, and pat dry.  Avoid using peroxide or rubbing alcohol as this causes irritation.  Avoid using bacitracin ointment as prolonged use can also worsen irritation. ? ?Return in about 1 week (around 03/31/2022) for Right lower extremity wound follow-up. ?___________________________________________ ?Thayer Ohm, DNP, APRN, FNP-BC ?Primary Care and Sports Medicine ?[name] ?

## 2022-03-24 NOTE — Patient Instructions (Signed)
Dressing instructions: ? ?Wash the wound with warm soapy water, rinse well and pat dry at least once daily. ? ?Moistened gauze with normal saline and apply to the wound bed only.  Okay to apply Vaseline or Aquaphor to wound edges to protect good skin.  Cover with gauze and secure in place.  Change every 12 hours.  If gauze sticks to the wound bed, will moisten with saline and allowed to soak for a few minutes then tried to remove it again. ? ?At the pharmacy look for 2 x 2 gauze (or similar small size), normal saline, and a large Band-Aid similar to what was applied.  Also could use rolled gauze and secured in place with tape if you prefer to stay away from the adhesives involved in Band-Aids. ?

## 2022-04-07 ENCOUNTER — Ambulatory Visit (INDEPENDENT_AMBULATORY_CARE_PROVIDER_SITE_OTHER): Payer: Managed Care, Other (non HMO) | Admitting: Family Medicine

## 2022-04-07 ENCOUNTER — Encounter: Payer: Self-pay | Admitting: Family Medicine

## 2022-04-07 VITALS — BP 132/86 | HR 87 | Ht 70.0 in | Wt 271.0 lb

## 2022-04-07 DIAGNOSIS — L97912 Non-pressure chronic ulcer of unspecified part of right lower leg with fat layer exposed: Secondary | ICD-10-CM | POA: Diagnosis not present

## 2022-04-07 DIAGNOSIS — L97909 Non-pressure chronic ulcer of unspecified part of unspecified lower leg with unspecified severity: Secondary | ICD-10-CM | POA: Insufficient documentation

## 2022-04-07 DIAGNOSIS — Z0000 Encounter for general adult medical examination without abnormal findings: Secondary | ICD-10-CM

## 2022-04-07 DIAGNOSIS — R739 Hyperglycemia, unspecified: Secondary | ICD-10-CM | POA: Diagnosis not present

## 2022-04-07 DIAGNOSIS — E781 Pure hyperglyceridemia: Secondary | ICD-10-CM

## 2022-04-07 NOTE — Progress Notes (Signed)
?Terrance Carlson - 41 y.o. male MRN [id number]  Date of birth: 1981-02-19 ? ?Subjective ?Chief Complaint  ?Patient presents with  ? Annual Exam  ? ? ?HPI ?Terrance Carlson is a 41 year old male here today for annual exam.  Overall he reports he is doing well.  He has a wound on his right lower extremity has been somewhat slow to heal.  He has been doing wet-to-dry dressing and keeping it covered.  He has completed a couple rounds of antibiotics as well.  Overall he does feel like this is improving. ? ?His job does keep him fairly active.  He has been working on making improvements to his diet. ? ?He is a former smoker and denies alcohol use. ? ?Review of Systems  ?Constitutional:  Negative for chills, fever, malaise/fatigue and weight loss.  ?HENT:  Negative for congestion, ear pain and sore throat.   ?Eyes:  Negative for blurred vision, double vision and pain.  ?Respiratory:  Negative for cough and shortness of breath.   ?Cardiovascular:  Negative for chest pain and palpitations.  ?Gastrointestinal:  Negative for abdominal pain, blood in stool, constipation, heartburn and nausea.  ?Genitourinary:  Negative for dysuria and urgency.  ?Musculoskeletal:  Negative for joint pain and myalgias.  ?Neurological:  Negative for dizziness and headaches.  ?Endo/Heme/Allergies:  Does not bruise/bleed easily.  ?Psychiatric/Behavioral:  Negative for depression. The patient is not nervous/anxious and does not have insomnia.   ? ? ?Allergies  ?Allergen Reactions  ? Influenza Vaccines Other (See Comments)  ?  Gianne Barre Syndrome  ? Other Other (See Comments)  ?  Patient is allergic to the Flu Shot.  ? ? ?Past Medical History:  ?Diagnosis Date  ? 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 High triglycerides 04/29/2016  ? History of melanoma 04/22/2016  ? Left abdomen status post excision in 2013.   ? Hyperglycemia 04/29/2016  ? OSA (obstructive sleep apnea) 05/31/2017  ? ? ?Past Surgical History:  ?Procedure Laterality  Date  ? OLECRANON BURSECTOMY Right 2017  ? ? ?Social History  ? ?Socioeconomic History  ? Marital status: Married  ?  Spouse name: Not on file  ? Number of children: Not on file  ? Years of education: Not on file  ? Highest education level: Not on file  ?Occupational History  ? Not on file  ?Tobacco Use  ? Smoking status: Former  ?  Types: Cigarettes  ? Smokeless tobacco: Never  ?Substance and Sexual Activity  ? Alcohol use: Not Currently  ?  Alcohol/week: 0.0 standard drinks  ? Drug use: Never  ? Sexual activity: Yes  ?  Partners: Female  ?Other Topics Concern  ? Not on file  ?Social History Narrative  ? Not on file  ? ?Social Determinants of Health  ? ?Financial Resource Strain: Not on file  ?Food Insecurity: Not on file  ?Transportation Needs: Not on file  ?Physical Activity: Not on file  ?Stress: Not on file  ?Social Connections: Not on file  ? ? ?History reviewed. No pertinent family history. ? ?Health Maintenance  ?Topic Date Due  ? COVID-19 Vaccine (1) 07/20/2022 (Originally 09/25/1981)  ? Hepatitis C Screening  04/08/2023 (Originally 03/26/1998)  ? TETANUS/TDAP  01/27/2030  ? HIV Screening  Completed  ? HPV VACCINES  Aged Out  ? ? ? ?----------------------------------------------------------------------------------------------------------------------------------------------------------------------------------------------------------------- ?Physical Exam ?BP 132/86 (BP Location: Right Arm, Patient Position: Sitting, Cuff Size: Large)   Pulse 87   Ht 5\' 10"  (1.778 m)   Wt 271 lb (122.9  kg)   SpO2 97%   BMI 38.88 kg/m?  ? ?Physical Exam ?Constitutional:   ?   General: He is not in acute distress. ?HENT:  ?   Head: Normocephalic and atraumatic.  ?   Right Ear: Tympanic membrane and external ear normal.  ?   Left Ear: Tympanic membrane and external ear normal.  ?Eyes:  ?   General: No scleral icterus. ?Neck:  ?   Thyroid: No thyromegaly.  ?Cardiovascular:  ?   Rate and Rhythm: Normal rate and regular rhythm.   ?   Heart sounds: Normal heart sounds.  ?Pulmonary:  ?   Effort: Pulmonary effort is normal.  ?   Breath sounds: Normal breath sounds.  ?Abdominal:  ?   General: Bowel sounds are normal. There is no distension.  ?   Palpations: Abdomen is soft.  ?   Tenderness: There is no abdominal tenderness. There is no guarding.  ?Musculoskeletal:  ?   Cervical back: Normal range of motion.  ?Lymphadenopathy:  ?   Cervical: No cervical adenopathy.  ?Skin: ?   General: Skin is warm and dry.  ?   Findings: No rash.  ?   Comments: Ulcerated area to the right lower shin.  Mild erythema without weeping or drainage.  ?Neurological:  ?   Mental Status: He is alert and oriented to person, place, and time.  ?   Cranial Nerves: No cranial nerve deficit.  ?   Motor: No abnormal muscle tone.  ?Psychiatric:     ?   Mood and Affect: Mood normal.     ?   Behavior: Behavior normal.  ? ? ?------------------------------------------------------------------------------------------------------------------------------------------------------------------------------------------------------------------- ?Assessment and Plan ? ?Well adult exam ?Well adult ?Orders Placed This Encounter  ?Procedures  ? COMPLETE METABOLIC PANEL WITH GFR  ? CBC with Differential  ? Lipid Panel w/reflex Direct LDL  ? TSH  ? HgB A1c  ?Screenings: Per lab orders ?Immunizations: Up-to-date ?Anticipatory guidance/risk factor reduction: Recommendations per AVS. ? ?Leg ulcer (HCC) ?He will continue with dry dressings and keeping area covered during the day.[name]  He will let this air out some at night and over the weekends.  No signs of infection at this time. ? ? ?No orders of the defined types were placed in this encounter. ? ? ?No follow-ups on file. ? ? ? ?This visit occurred during the SARS-CoV-2 public health emergency.  Safety protocols were in place, including screening questions prior to the visit, additional usage of staff PPE, and extensive cleaning of exam room while  observing appropriate contact time as indicated for disinfecting solutions.  ?= ? ?

## 2022-04-07 NOTE — Patient Instructions (Signed)

## 2022-04-07 NOTE — Assessment & Plan Note (Signed)
Well adult Orders Placed This Encounter  Procedures   COMPLETE METABOLIC PANEL WITH GFR   CBC with Differential   Lipid Panel w/reflex Direct LDL   TSH   HgB A1c  Screenings: Per lab orders Immunizations: Up-to-date Anticipatory guidance/risk factor reduction: Recommendations per AVS 

## 2022-04-07 NOTE — Assessment & Plan Note (Signed)
He will continue with dry dressings and keeping area covered during the day.[name]  He will let this air out some at night and over the weekends.  No signs of infection at this time. ?

## 2022-04-08 LAB — COMPLETE METABOLIC PANEL WITH GFR
AG Ratio: 1.6 (calc) (ref 1.0–2.5)
ALT: 34 U/L (ref 9–46)
AST: 19 U/L (ref 10–40)
Albumin: 4.5 g/dL (ref 3.6–5.1)
Alkaline phosphatase (APISO): 71 U/L (ref 36–130)
BUN: 14 mg/dL (ref 7–25)
CO2: 26 mmol/L (ref 20–32)
Calcium: 9.7 mg/dL (ref 8.6–10.3)
Chloride: 103 mmol/L (ref 98–110)
Creat: 0.82 mg/dL (ref 0.60–1.29)
Globulin: 2.8 g/dL (calc) (ref 1.9–3.7)
Glucose, Bld: 84 mg/dL (ref 65–99)
Potassium: 3.9 mmol/L (ref 3.5–5.3)
Sodium: 140 mmol/L (ref 135–146)
Total Bilirubin: 0.4 mg/dL (ref 0.2–1.2)
Total Protein: 7.3 g/dL (ref 6.1–8.1)
eGFR: 112 mL/min/{1.73_m2} (ref >=60)

## 2022-04-08 LAB — CBC WITH DIFFERENTIAL/PLATELET
Absolute Monocytes: 558 cells/uL (ref 200–950)
Basophils Absolute: 47 cells/uL (ref 0–200)
Basophils Relative: 0.5 %
Eosinophils Absolute: 74 cells/uL (ref 15–500)
Eosinophils Relative: 0.8 %
HCT: 43 % (ref 38.5–50.0)
Hemoglobin: 15.1 g/dL (ref 13.2–17.1)
Lymphs Abs: 2232 cells/uL (ref 850–3900)
MCH: 31 pg (ref 27.0–33.0)
MCHC: 35.1 g/dL (ref 32.0–36.0)
MCV: 88.3 fL (ref 80.0–100.0)
MPV: 10.2 fL (ref 7.5–12.5)
Monocytes Relative: 6 %
Neutro Abs: 6389 cells/uL (ref 1500–7800)
Neutrophils Relative %: 68.7 %
Platelets: 283 10*3/uL (ref 140–400)
RBC: 4.87 10*6/uL (ref 4.20–5.80)
RDW: 13.2 % (ref 11.0–15.0)
Total Lymphocyte: 24 %
WBC: 9.3 10*3/uL (ref 3.8–10.8)

## 2022-04-08 LAB — TSH: TSH: 1.43 mIU/L (ref 0.40–4.50)

## 2022-04-08 LAB — HEMOGLOBIN A1C
Hgb A1c MFr Bld: 5.4 % of total Hgb (ref <5.7)
Mean Plasma Glucose: 108 mg/dL
eAG (mmol/L): 6 mmol/L

## 2022-04-08 LAB — LIPID PANEL W/REFLEX DIRECT LDL
Cholesterol: 261 mg/dL — ABNORMAL HIGH (ref <200)
HDL: 39 mg/dL — ABNORMAL LOW (ref >=40)
Non-HDL Cholesterol (Calc): 222 mg/dL (calc) — ABNORMAL HIGH (ref <130)
Total CHOL/HDL Ratio: 6.7 (calc) — ABNORMAL HIGH (ref <5.0)
Triglycerides: 794 mg/dL — ABNORMAL HIGH (ref <150)

## 2022-04-08 LAB — DIRECT LDL: Direct LDL: 96 mg/dL (ref <100)

## 2022-04-13 ENCOUNTER — Other Ambulatory Visit: Payer: Self-pay | Admitting: Family Medicine

## 2022-04-13 ENCOUNTER — Encounter: Payer: Self-pay | Admitting: Family Medicine

## 2022-04-13 MED ORDER — ROSUVASTATIN CALCIUM 20 MG PO TABS: 20.0000 mg | ORAL_TABLET | Freq: Every day | ORAL | 0 refills | Status: DC

## 2022-05-24 ENCOUNTER — Encounter: Payer: Self-pay | Admitting: Family Medicine

## 2022-05-24 ENCOUNTER — Encounter: Payer: Self-pay | Admitting: Physician Assistant

## 2022-05-24 ENCOUNTER — Telehealth: Payer: Managed Care, Other (non HMO) | Admitting: Physician Assistant

## 2022-05-24 DIAGNOSIS — Z20822 Contact with and (suspected) exposure to covid-19: Secondary | ICD-10-CM

## 2022-05-24 MED ORDER — NIRMATRELVIR/RITONAVIR (PAXLOVID)TABLET: 3.0000 | ORAL_TABLET | Freq: Two times a day (BID) | ORAL | 0 refills | Status: AC

## 2022-05-24 NOTE — Progress Notes (Signed)
Mr. Terrance Carlson, Terrance Carlson are scheduled for a virtual visit with your provider today.    Just as we do with appointments in the office, we must obtain your consent to participate.  Your consent will be active for this visit and any virtual visit you may have with one of our providers in the next 365 days.    If you have a MyChart account, I can also send a copy of this consent to you electronically.  All virtual visits are billed to your insurance company just like a traditional visit in the office.  As this is a virtual visit, video technology does not allow for your provider to perform a traditional examination.  This may limit your provider's ability to fully assess your condition.  If your provider identifies any concerns that need to be evaluated in person or the need to arrange testing such as labs, EKG, etc, we will make arrangements to do so.    Although advances in technology are sophisticated, we cannot ensure that it will always work on either your end or our end.  If the connection with a video visit is poor, we may have to switch to a telephone visit.  With either a video or telephone visit, we are not always able to ensure that we have a secure connection.   I need to obtain your verbal consent now.   Are you willing to proceed with your visit today?   [name] has provided verbal consent on 05/24/2022 for a virtual visit (video or telephone).   [name], PA-C 05/24/2022  11:56 AM   Date:  05/24/2022   ID:  [name], DOB Nov 07, 1981, MRN [id number]  Patient Location: Home Provider Location: Home Office   Participants: Patient and Provider for Visit and Wrap up  Method of visit: Video  Location of Patient: Home Location of Provider: Home Office Consent was obtain for visit over the video. Services rendered by provider: Visit was performed via video  A video enabled telemedicine application was used and I verified that I am speaking with the correct person using two  identifiers.  PCP:  Everrett Coombe, DO   Chief Complaint:  covid exposure  History of Present Illness:    Terrance Carlson is a 41 y.o. male with history as stated below. Presents video telehealth for an acute care visit  Pt states that two members of his household have tested positive for covid. He woke up today with body aches, cough, and a sore throat.   Pt reports a h/o immunosuppression and is currently on ivig infusions.   Past Medical, Surgical, Social History, Allergies, and Medications have been Reviewed.  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    Hyperglycemia 04/29/2016   OSA (obstructive sleep apnea) 05/31/2017    Current Meds  Medication Sig   nirmatrelvir/ritonavir EUA (PAXLOVID) 20 x 150 MG & 10 x 100MG  TABS Take 3 tablets by mouth 2 (two) times daily for 5 days. (Take nirmatrelvir 150 mg two tablets twice daily for 5 days and ritonavir 100 mg one tablet twice daily for 5 days) Patient GFR is >60     Allergies:   Influenza vaccines and Other   ROS See HPI for history of present illness.  Physical Exam Constitutional:      Appearance: Normal appearance.  Pulmonary:  Effort: Pulmonary effort is normal.   MDM: pt with covid exposure and covid sxs today. Is immunosuppressed. Will tx for suspected covid with paxlovid. Advised to hold statin and f/u with pcp.             There are no diagnoses linked to this encounter.   Time:   Today, I have spent 11 minutes with the patient with telehealth technology discussing the above problems, reviewing the chart, previous notes, medications and orders.    Tests Ordered: No orders of the defined types were placed in this encounter.   Medication Changes: Meds ordered this encounter  Medications   nirmatrelvir/ritonavir EUA (PAXLOVID) 20  x 150 MG & 10 x 100MG  TABS    Sig: Take 3 tablets by mouth 2 (two) times daily for 5 days. (Take nirmatrelvir 150 mg two tablets twice daily for 5 days and ritonavir 100 mg one tablet twice daily for 5 days) Patient GFR is >60    Dispense:  30 tablet    Refill:  0    Order Specific Question:   Supervising Provider    Answer:   [[id number]]     Disposition:  Follow up  Signed, Danaysia Rader Eber Hong, PA-C  05/24/2022 11:56 AM

## 2022-05-24 NOTE — Patient Instructions (Signed)
  Terrance Carlson, thank you for joining [name], PA-C for today's virtual visit.  While this provider is not your primary care provider (PCP), if your PCP is located in our provider database this encounter information will be shared with them immediately following your visit.  Consent: (Patient) Terrance Carlson provided verbal consent for this virtual visit at the beginning of the encounter.  Current Medications:  Current Outpatient Medications:    Cholecalciferol (VITAMIN D3) 5000 UNIT/ML LIQD, Take by mouth., Disp: , Rfl:    gabapentin (NEURONTIN) 600 MG tablet, Take 600 mg by mouth 2 (two) times daily as needed., Disp: , Rfl:    Omega-3 Fatty Acids (FISH OIL) 1000 MG CAPS, Take 1,000 mg by mouth daily., Disp: , Rfl:    rosuvastatin (CRESTOR) 20 MG tablet, Take 1 tablet (20 mg total) by mouth daily., Disp: 90 tablet, Rfl: 0   vitamin B-12 (CYANOCOBALAMIN) 500 MCG tablet, Take 1 tablet by mouth daily., Disp: , Rfl:    Medications ordered in this encounter:  No orders of the defined types were placed in this encounter.    *If you need refills on other medications prior to your next appointment, please contact your pharmacy*  Follow-Up: Call back or seek an in-person evaluation if the symptoms worsen or if the condition fails to improve as anticipated.  Other Instructions Hold you cholesterol medication while taking paxlovid  Take paxlovid as directed   Follow up with your regular doctor in 1 week for reassessment and seek care sooner if your symptoms worsen or fail to improve.    If you have been instructed to have an in-person evaluation today at a local Urgent Care facility, please use the link below. It will take you to a list of all of our available [facility], including address, phone number and hours of operation. Please do not delay care.  [facility]  If you or a family member do not have a primary care provider, use the link below to schedule a  visit and establish care. When you choose a [facility] primary care physician or advanced practice provider, you gain a long-term partner in health. Find a Primary Care Provider  Learn more about [facility]'s in-office and virtual care options: [facility] - Get Care Now

## 2022-07-10 ENCOUNTER — Other Ambulatory Visit: Payer: Self-pay | Admitting: Family Medicine

## 2022-10-04 ENCOUNTER — Ambulatory Visit: Payer: Managed Care, Other (non HMO) | Admitting: Family Medicine

## 2022-10-04 ENCOUNTER — Telehealth: Payer: Managed Care, Other (non HMO) | Admitting: Physician Assistant

## 2022-10-07 ENCOUNTER — Other Ambulatory Visit: Payer: Self-pay | Admitting: Family Medicine

## 2022-10-07 ENCOUNTER — Encounter: Payer: Self-pay | Admitting: Sports Medicine

## 2022-10-07 ENCOUNTER — Ambulatory Visit (INDEPENDENT_AMBULATORY_CARE_PROVIDER_SITE_OTHER): Payer: Managed Care, Other (non HMO) | Admitting: Sports Medicine

## 2022-10-07 DIAGNOSIS — M2041 Other hammer toe(s) (acquired), right foot: Secondary | ICD-10-CM | POA: Diagnosis not present

## 2022-10-07 DIAGNOSIS — M25572 Pain in left ankle and joints of left foot: Secondary | ICD-10-CM | POA: Diagnosis not present

## 2022-10-07 DIAGNOSIS — M25571 Pain in right ankle and joints of right foot: Secondary | ICD-10-CM

## 2022-10-07 DIAGNOSIS — R2689 Other abnormalities of gait and mobility: Secondary | ICD-10-CM | POA: Diagnosis not present

## 2022-10-07 DIAGNOSIS — M2141 Flat foot [pes planus] (acquired), right foot: Secondary | ICD-10-CM

## 2022-10-07 NOTE — Progress Notes (Signed)
Complaining of flat foot Says they turn in when they walk Not painful

## 2022-10-07 NOTE — Progress Notes (Signed)
Terrance Carlson - 41 y.o. male MRN [id number]  Date of birth: June 03, 1981  Office Visit Note: Visit Date: 10/07/2022 PCP: Everrett Coombe, DO Referred by: Everrett Coombe, DO  Subjective: Chief Complaint  Patient presents with   Left Foot - Flat Foot   Right Foot - Flat Foot   HPI: Terrance Carlson is a pleasant 41 y.o. male who presents today for pes planovalgus and bilateral foot pain.  I have had the pleasure of taking care of his younger son who has had some foot issues.  Lyrick states that he and his son have similar feet.  Has noticed his feet are flat.  We will get some pain in the arches with longer distances.  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  Denies any acute injury.  Pertinent ROS were reviewed with the patient and found to be negative unless otherwise specified above in HPI.   Assessment & Plan: Visit Diagnoses:  1. Pes planus of both feet   2. Functional gait abnormality   3. Other hammer toe(s) (acquired), right foot    Plan: Evaluate Achilles stance and his gait which shows significant acquired pes planovalgus with hindfoot valgus and collapse of the bilateral longitudinal arches.  Through shared decision making, elected to proceed with green sports insoles with medium scaphoid pad bilaterally.  Patient walk with these and found improvement in his gait.  I will let him try these for the next 2 weeks in which his foot should settle into the orthotic.  If he is still having some issues following this, he may return and I can adjust the insoles as needed.  I did discuss with him if he finds these beneficial and would like a more permanent solution, I can always send him to the sports medicine center for more permanent orthotics.  He will follow-up as needed with me, may call or message sooner if any issues arise.  Follow-up: PRN   Meds & Orders: No orders of the defined types were placed in this encounter.  No orders of the defined types were placed  in this encounter.    Procedures: No procedures performed      Clinical History: No specialty comments available.  He reports that he has quit smoking. His smoking use included cigarettes. He has never used smokeless tobacco.  Recent Labs    04/07/22 0000  HGBA1C 5.4    Objective:    Physical Exam  Gen: Well-appearing, in no acute distress; non-toxic CV: Well-perfused. Warm.  Resp: Breathing unlabored on room air; no wheezing. Psych: Fluid speech in conversation; appropriate affect; normal thought process Neuro: Sensation intact throughout. No gross coordination deficits.   Ortho Exam - Bilateral feet: Bilateral pes planovalgus with collapse of longitudinal arch upon standing.  There is mild widening of the transverse arch of the right foot with early hammering of toes 3 and 4.  He does have hindfoot valgus bilaterally.  Walking through his gait, he does have overpronation of both feet from the heel-to-toe transition.  Dorsalis pedis pulse palpable.  No skin abnormalities.  Imaging: No results found.  Past Medical/Family/Surgical/Social History: Medications & Allergies reviewed per EMR, new medications updated. 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    Hyperglycemia  04/29/2016   OSA (obstructive sleep apnea) 05/31/2017   History reviewed. No pertinent family history. Past Surgical History:  Procedure Laterality Date   OLECRANON BURSECTOMY Right 2017   Social History   Occupational History   Not on file  Tobacco Use   Smoking status: Former    Types: Cigarettes   Smokeless tobacco: Never  Substance and Sexual Activity   Alcohol use: Not Currently    Alcohol/week: 0.0 standard drinks of alcohol   Drug use: Never   Sexual activity: Yes    Partners: Female

## 2022-10-08 NOTE — Telephone Encounter (Signed)
Lvm for patient to call back to schedule a appointment for med refills- tvt

## 2022-10-18 ENCOUNTER — Other Ambulatory Visit: Payer: Self-pay | Admitting: Family Medicine

## 2022-10-18 ENCOUNTER — Ambulatory Visit (INDEPENDENT_AMBULATORY_CARE_PROVIDER_SITE_OTHER): Payer: Managed Care, Other (non HMO)

## 2022-10-18 ENCOUNTER — Ambulatory Visit (INDEPENDENT_AMBULATORY_CARE_PROVIDER_SITE_OTHER): Payer: Managed Care, Other (non HMO) | Admitting: Family Medicine

## 2022-10-18 ENCOUNTER — Encounter: Payer: Self-pay | Admitting: Family Medicine

## 2022-10-18 VITALS — BP 117/72 | HR 65 | Ht 70.0 in | Wt 274.0 lb

## 2022-10-18 DIAGNOSIS — R052 Subacute cough: Secondary | ICD-10-CM

## 2022-10-18 DIAGNOSIS — R0602 Shortness of breath: Secondary | ICD-10-CM | POA: Diagnosis not present

## 2022-10-18 DIAGNOSIS — E781 Pure hyperglyceridemia: Secondary | ICD-10-CM | POA: Diagnosis not present

## 2022-10-18 DIAGNOSIS — J989 Respiratory disorder, unspecified: Secondary | ICD-10-CM | POA: Diagnosis not present

## 2022-10-18 MED ORDER — ALBUTEROL SULFATE HFA 108 (90 BASE) MCG/ACT IN AERS: 2.0000 | INHALATION_SPRAY | Freq: Four times a day (QID) | RESPIRATORY_TRACT | 0 refills | Status: DC | PRN

## 2022-10-18 MED ORDER — AMOXICILLIN-POT CLAVULANATE 875-125 MG PO TABS: 1.0000 | ORAL_TABLET | Freq: Two times a day (BID) | ORAL | 0 refills | Status: DC

## 2022-10-18 NOTE — Assessment & Plan Note (Signed)
Has tried doxycycline without improvement.  He does have some decreased bibasilar breath sounds.  CXR ordered.

## 2022-10-18 NOTE — Assessment & Plan Note (Signed)
Has made changes to diet.  Tolerating crestor well.  Updating lipid and LFT's.

## 2022-10-18 NOTE — Progress Notes (Signed)
Terrance Carlson - 41 y.o. male MRN [id number]  Date of birth: 1981-01-07  Subjective Chief Complaint  Patient presents with   URI   Cough    HPI Terrance Carlson is a 41 y.o. male here today for follow up visit.   He has had respiratory illness for about 3 weeks.  Seen by urgent care previously and started on doxycycline and tessalon.  Reports that he had no improvement with this.  Continues to have cough with yellow, thickened sputum.  Has some shortness of breath and wheezing.  Denies fever or chills.    Started on crestor at previous visit.  He is taking this regularly and tolerating well.  Denies side effects of myalgias or abdominal pain.   He has made some dietary changes.   ROS:  A comprehensive ROS was completed and negative except as noted per HPI  Allergies  Allergen Reactions   Influenza Vaccines Other (See Comments)    Gianne Barre Syndrome   Other Other (See Comments)    Patient is allergic to the Flu Shot.    Past Medical History:  Diagnosis Date   CIDP (chronic inflammatory demyelinating polyneuropathy) ([place]) 08/12/2017   High triglycerides 04/29/2016   History of melanoma 04/22/2016   Left abdomen status post excision in 2013.    Hyperglycemia 04/29/2016   OSA (obstructive sleep apnea) 05/31/2017    Past Surgical History:  Procedure Laterality Date   OLECRANON BURSECTOMY Right 2017    Social History   Socioeconomic History   Marital status: Married    Spouse name: Not on file   Number of children: Not on file   Years of education: Not on file   Highest education level: Not on file  Occupational History   Not on file  Tobacco Use   Smoking status: Former    Types: Cigarettes   Smokeless tobacco: Never  Substance and Sexual Activity   Alcohol use: Not Currently    Alcohol/week: 0.0 standard drinks of alcohol   Drug use: Never   Sexual activity: Yes    Partners: Female  Other Topics Concern   Not on file  Social History Narrative   Not on file   Social  Determinants of Health   Financial Resource Strain: Not on file  Food Insecurity: Not on file  Transportation Needs: Not on file  Physical Activity: Not on file  Stress: Not on file  Social Connections: Not on file    History reviewed. No pertinent family history.  Health Maintenance  Topic Date Due   COVID-19 Vaccine (1) 01/20/2023 (Originally 09/25/1981)   Hepatitis C Screening  04/08/2023 (Originally 03/26/1998)   TETANUS/TDAP  01/27/2030   HIV Screening  Completed   HPV VACCINES  Aged Out     ----------------------------------------------------------------------------------------------------------------------------------------------------------------------------------------------------------------- Physical Exam BP 117/72 (BP Location: Left Arm, Patient Position: Sitting, Cuff Size: Large)   Pulse 65   Ht 5\' 10"  (1.778 m)   Wt 274 lb (124.3 kg)   SpO2 96%   BMI 39.31 kg/m   Physical Exam Constitutional:      Appearance: Normal appearance.  Cardiovascular:     Rate and Rhythm: Normal rate and regular rhythm.  Pulmonary:     Effort: Pulmonary effort is normal.     Comments: Decreased bibasilar breath sounds, improves with cough.  Musculoskeletal:     Cervical back: Neck supple.  Neurological:     General: No focal deficit present.     Mental Status: He is alert.  Psychiatric:  Mood and Affect: Mood normal.        Behavior: Behavior normal.     ------------------------------------------------------------------------------------------------------------------------------------------------------------------------------------------------------------------- Assessment and Plan  Subacute cough Has tried doxycycline without improvement.  He does have some decreased bibasilar breath sounds.  CXR ordered.    High triglycerides Has made changes to diet.  Tolerating crestor well.  Updating lipid and LFT's.   No orders of the defined types were placed in this  encounter.   No follow-ups on file.    This visit occurred during the SARS-CoV-2 public health emergency.  Safety protocols were in place, including screening questions prior to the visit, additional usage of staff PPE, and extensive cleaning of exam room while observing appropriate contact time as indicated for disinfecting solutions.

## 2022-10-18 NOTE — Patient Instructions (Addendum)
We'll be in touch with xray and lab results.  

## 2022-10-19 LAB — COMPLETE METABOLIC PANEL WITH GFR
AG Ratio: 1.9 (calc) (ref 1.0–2.5)
ALT: 42 U/L (ref 9–46)
AST: 21 U/L (ref 10–40)
Albumin: 4.6 g/dL (ref 3.6–5.1)
Alkaline phosphatase (APISO): 68 U/L (ref 36–130)
BUN: 14 mg/dL (ref 7–25)
CO2: 23 mmol/L (ref 20–32)
Calcium: 9.7 mg/dL (ref 8.6–10.3)
Chloride: 104 mmol/L (ref 98–110)
Creat: 0.73 mg/dL (ref 0.60–1.29)
Globulin: 2.4 g/dL (calc) (ref 1.9–3.7)
Glucose, Bld: 95 mg/dL (ref 65–99)
Potassium: 4.5 mmol/L (ref 3.5–5.3)
Sodium: 138 mmol/L (ref 135–146)
Total Bilirubin: 0.6 mg/dL (ref 0.2–1.2)
Total Protein: 7 g/dL (ref 6.1–8.1)
eGFR: 116 mL/min/{1.73_m2} (ref >=60)

## 2022-10-19 LAB — CBC WITH DIFFERENTIAL/PLATELET
Absolute Monocytes: 598 cells/uL (ref 200–950)
Basophils Absolute: 37 cells/uL (ref 0–200)
Basophils Relative: 0.4 %
Eosinophils Absolute: 46 cells/uL (ref 15–500)
Eosinophils Relative: 0.5 %
HCT: 45.4 % (ref 38.5–50.0)
Hemoglobin: 15.5 g/dL (ref 13.2–17.1)
Lymphs Abs: 2134 cells/uL (ref 850–3900)
MCH: 30.7 pg (ref 27.0–33.0)
MCHC: 34.1 g/dL (ref 32.0–36.0)
MCV: 89.9 fL (ref 80.0–100.0)
MPV: 10.3 fL (ref 7.5–12.5)
Monocytes Relative: 6.5 %
Neutro Abs: 6385 cells/uL (ref 1500–7800)
Neutrophils Relative %: 69.4 %
Platelets: 263 10*3/uL (ref 140–400)
RBC: 5.05 10*6/uL (ref 4.20–5.80)
RDW: 13.2 % (ref 11.0–15.0)
Total Lymphocyte: 23.2 %
WBC: 9.2 10*3/uL (ref 3.8–10.8)

## 2022-10-19 LAB — LIPID PANEL W/REFLEX DIRECT LDL
Cholesterol: 157 mg/dL (ref <200)
HDL: 60 mg/dL (ref >=40)
Non-HDL Cholesterol (Calc): 97 mg/dL (calc) (ref <130)
Total CHOL/HDL Ratio: 2.6 (calc) (ref <5.0)
Triglycerides: 409 mg/dL — ABNORMAL HIGH (ref <150)

## 2022-10-19 LAB — DIRECT LDL: Direct LDL: 56 mg/dL (ref <100)

## 2022-10-22 ENCOUNTER — Encounter: Payer: Self-pay | Admitting: Family Medicine

## 2022-10-22 MED ORDER — ICOSAPENT ETHYL 1 G PO CAPS: 2.0000 g | ORAL_CAPSULE | Freq: Two times a day (BID) | ORAL | 1 refills | Status: DC

## 2022-10-25 ENCOUNTER — Telehealth: Payer: Self-pay

## 2022-10-25 NOTE — Telephone Encounter (Signed)
Hi Keyairea,  I did not see a  note about the PA for the [employer] in the patient's chart.  Will you check into this? Thank you :) Blima Singer, LPN

## 2022-10-25 NOTE — Telephone Encounter (Addendum)
Initiated Prior authorization [id number] Ethyl 1GM capsules Via: Covermymeds Case/Key:[id number] Status: approved  as of 10/25/22 Reason:approved through 10/26/2023. Notified Pt via: Mychart

## 2022-11-01 ENCOUNTER — Encounter: Payer: Self-pay | Admitting: Family Medicine

## 2022-11-08 ENCOUNTER — Encounter: Payer: Self-pay | Admitting: Family Medicine

## 2022-11-08 ENCOUNTER — Ambulatory Visit (INDEPENDENT_AMBULATORY_CARE_PROVIDER_SITE_OTHER): Payer: Managed Care, Other (non HMO) | Admitting: Family Medicine

## 2022-11-08 VITALS — BP 119/75 | HR 76 | Ht 70.0 in | Wt 272.0 lb

## 2022-11-08 DIAGNOSIS — R052 Subacute cough: Secondary | ICD-10-CM

## 2022-11-08 MED ORDER — PREDNISONE 10 MG (48) PO TBPK: ORAL_TABLET | ORAL | 0 refills | Status: DC

## 2022-11-08 MED ORDER — HYDROCODONE BIT-HOMATROP MBR 5-1.5 MG/5ML PO SOLN: 5.0000 mL | Freq: Four times a day (QID) | ORAL | 0 refills | Status: DC | PRN

## 2022-11-08 MED ORDER — BENZONATATE 100 MG PO CAPS: 100.0000 mg | ORAL_CAPSULE | Freq: Three times a day (TID) | ORAL | 0 refills | Status: DC | PRN

## 2022-11-08 NOTE — Assessment & Plan Note (Signed)
Continues to have cough.  Has noted some wheezing.  Negative chest x-ray previously.  Start prednisone taper.  Adding Tessalon perles as needed with Hycodan syrup at night as needed.  Recommend that he stay well-hydrated.  A humidifier in the bedroom is recommended as well.  We discussed that if symptoms or not improving we can consider CT scan of the lungs

## 2022-11-08 NOTE — Progress Notes (Signed)
Terrance Carlson - 41 y.o. male MRN [id number]  Date of birth: 12-May-1981  Subjective Chief Complaint  Patient presents with   Follow-up    HPI Terrance Carlson is a 41 y.o. male here today with complaint of continued cough.  Has completed course of doxycycline and augmentin.  Continues to have cough.  Cough is non-productive.  It does keep him up at night some.  He has noticed some wheezing at times.  He denies dyspnea, fever, chills, sinus pain.  He is staying pretty well hydrated.  He has tried mucinex without much releif.    ROS:  A comprehensive ROS was completed and negative except as noted per HPI  Allergies  Allergen Reactions   Influenza Vaccines Other (See Comments)    Gianne Barre Syndrome   Other Other (See Comments)    Patient is allergic to the Flu Shot.    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    Hyperglycemia 04/29/2016   OSA (obstructive sleep apnea) 05/31/2017    Past Surgical History:  Procedure Laterality Date   OLECRANON BURSECTOMY Right 2017    Social History   Socioeconomic History   Marital status: Married    Spouse name: Not on file   Number of children: Not on file   Years of education: Not on file   Highest education level: Not on file  Occupational History   Not on file  Tobacco Use   Smoking status: Former    Types: Cigarettes   Smokeless tobacco: Never  Substance and Sexual Activity   Alcohol use: Not Currently    Alcohol/week: 0.0 standard drinks of alcohol   Drug use: Never   Sexual activity: Yes    Partners: Female  Other Topics Concern   Not on file  Social History Narrative   Not on file   Social Determinants of Health   Financial Resource Strain: Not on file  Food Insecurity: Not on file  Transportation Needs: Not on file  Physical Activity: Not on  file  Stress: Not on file  Social Connections: Not on file    History reviewed. No pertinent family history.  Health Maintenance  Topic Date Due   COVID-19 Vaccine (1) 01/20/2023 (Originally 09[birth year]25/1980)   Hepatitis C Screening  04/08/2023 (Originally 03/26/1998)   HIV Screening  Completed   HPV VACCINES  Aged Out     ----------------------------------------------------------------------------------------------------------------------------------------------------------------------------------------------------------------- Physical Exam BP 119/75 (BP Location: Left Arm, Patient Position: Sitting, Cuff Size: Large)   Pulse 76   Ht 5\' 10"  (1.778 m)   Wt 272 lb (123.4 kg)   SpO2 94%   BMI 39.03 kg/m   Physical Exam Constitutional:      Appearance: Normal appearance.  HENT:     Head: Normocephalic and atraumatic.  Eyes:     General: No scleral icterus. Cardiovascular:     Rate and Rhythm: Normal rate and regular rhythm.  Pulmonary:     Effort: Pulmonary effort is normal.     Breath sounds: Normal breath sounds.  Musculoskeletal:     Cervical back: Neck supple.  Neurological:     Mental Status: He is alert.  Psychiatric:        Mood and Affect: Mood normal.        Behavior: Behavior normal.     ------------------------------------------------------------------------------------------------------------------------------------------------------------------------------------------------------------------- Assessment and Plan  Subacute cough Continues  to have cough.  Has noted some wheezing.  Negative chest x-ray previously.  Start prednisone taper.  Adding Tessalon perles as needed with Hycodan syrup at night as needed.  Recommend that he stay well-hydrated.  A humidifier in the bedroom is recommended as well.  We discussed that if symptoms or not improving we can consider CT scan of the lungs   Meds ordered this encounter  Medications   DISCONTD: predniSONE (STERAPRED  UNI-PAK 48 TAB) 10 MG (48) TBPK tablet    Sig: Take as directed on packaging. 12 day taper.    Dispense:  48 tablet    Refill:  0   DISCONTD: benzonatate (TESSALON) 100 MG capsule    Sig: Take 1 capsule (100 mg total) by mouth 3 (three) times daily as needed.    Dispense:  40 capsule    Refill:  0   DISCONTD: HYDROcodone bit-homatropine (HYCODAN) 5-1.5 MG/5ML syrup    Sig: Take 5 mLs by mouth every 6 (six) hours as needed for cough.    Dispense:  120 mL    Refill:  0   benzonatate (TESSALON) 100 MG capsule    Sig: Take 1 capsule (100 mg total) by mouth 3 (three) times daily as needed.    Dispense:  40 capsule    Refill:  0   HYDROcodone bit-homatropine (HYCODAN) 5-1.5 MG/5ML syrup    Sig: Take 5 mLs by mouth every 6 (six) hours as needed for cough.    Dispense:  120 mL    Refill:  0   predniSONE (STERAPRED UNI-PAK 48 TAB) 10 MG (48) TBPK tablet    Sig: Take as directed on packaging. 12 day taper.    Dispense:  48 tablet    Refill:  0    No follow-ups on file.    This visit occurred during the SARS-CoV-2 public health emergency.  Safety protocols were in place, including screening questions prior to the visit, additional usage of staff PPE, and extensive cleaning of exam room while observing appropriate contact time as indicated for disinfecting solutions.

## 2022-11-08 NOTE — Patient Instructions (Signed)
Start prednisone taper as directed  Try tessalon perles as needed for cough.  Cough syrup at night as needed.  Add humidifier in bedroom and nasal saline.  Let me know if not improving.

## 2022-11-09 ENCOUNTER — Other Ambulatory Visit: Payer: Self-pay | Admitting: Family Medicine

## 2023-03-18 ENCOUNTER — Encounter: Payer: Self-pay | Admitting: Family Medicine

## 2023-04-01 MED ORDER — ICOSAPENT ETHYL 1 G PO CAPS: 2.0000 g | ORAL_CAPSULE | Freq: Two times a day (BID) | ORAL | 1 refills | Status: DC

## 2023-04-21 ENCOUNTER — Encounter: Payer: Managed Care, Other (non HMO) | Admitting: Family Medicine

## 2023-04-21 ENCOUNTER — Encounter: Payer: Self-pay | Admitting: Family Medicine

## 2023-04-21 ENCOUNTER — Telehealth: Payer: Managed Care, Other (non HMO) | Admitting: Family Medicine

## 2023-04-21 NOTE — Telephone Encounter (Signed)
Spoke with patient and he reports that there was evidently a misunderstanding as far as scheduling as he thought that both he and his fiancee were set up for an appt.  He is upset because when he arrived he felt like he was treated rudely by  our front office and clinical staff and he was embarrassed by them, as he was under the impression that two appointments were made. I apologized for his experience and I let him know that based on the appt. Notes I felt that he should have been contacted sooner to clarify what he was coming in for, and if his fiancee needed an appt. As well.   I let him know that I would have Terrance Carlson contact him to get them both re-scheduled and make Terrance Carlson aware of this as well.    -Jenn:  Please contact patient and his fiancee to schedule appts for each of them.  -Terrance Carlson:  More of an FYI that we need to scrub the schedule to be sure patients are scheduled appropriately.  I smoothed things over with him for now. So I don't think an additional phone call is necessary.    Thanks!

## 2023-04-22 ENCOUNTER — Encounter: Payer: Self-pay | Admitting: Family Medicine

## 2023-04-25 MED ORDER — PREDNISONE 50 MG PO TABS: ORAL_TABLET | ORAL | 0 refills | Status: DC

## 2023-04-25 NOTE — Addendum Note (Signed)
Addended by: Mammie Lorenzo on: 04/25/2023 04:49 PM   Modules accepted: Orders

## 2023-05-05 ENCOUNTER — Encounter: Payer: Self-pay | Admitting: Family Medicine

## 2023-05-05 ENCOUNTER — Ambulatory Visit (INDEPENDENT_AMBULATORY_CARE_PROVIDER_SITE_OTHER): Payer: Managed Care, Other (non HMO) | Admitting: Family Medicine

## 2023-05-05 VITALS — BP 126/74 | HR 78 | Ht 70.0 in | Wt 266.0 lb

## 2023-05-05 DIAGNOSIS — R739 Hyperglycemia, unspecified: Secondary | ICD-10-CM | POA: Diagnosis not present

## 2023-05-05 DIAGNOSIS — E781 Pure hyperglyceridemia: Secondary | ICD-10-CM

## 2023-05-05 DIAGNOSIS — J329 Chronic sinusitis, unspecified: Secondary | ICD-10-CM

## 2023-05-05 DIAGNOSIS — Z0000 Encounter for general adult medical examination without abnormal findings: Secondary | ICD-10-CM

## 2023-05-05 DIAGNOSIS — E559 Vitamin D deficiency, unspecified: Secondary | ICD-10-CM

## 2023-05-05 DIAGNOSIS — J40 Bronchitis, not specified as acute or chronic: Secondary | ICD-10-CM

## 2023-05-05 MED ORDER — DOXYCYCLINE HYCLATE 100 MG PO TABS: 100.0000 mg | ORAL_TABLET | Freq: Two times a day (BID) | ORAL | 0 refills | Status: DC

## 2023-05-05 MED ORDER — ICOSAPENT ETHYL 1 G PO CAPS: 2.0000 g | ORAL_CAPSULE | Freq: Two times a day (BID) | ORAL | 1 refills | Status: DC

## 2023-05-05 NOTE — Assessment & Plan Note (Signed)
Well adult Orders Placed This Encounter  Procedures   COMPLETE METABOLIC PANEL WITH GFR   CBC with Differential   Lipid Panel w/reflex Direct LDL   TSH   HgB A1c   Vitamin D (25 hydroxy)   Screenings: per lab orders Immunizations: UTD Anticipatory guidance/Risk factor reduction:  Recommendations per AVS.

## 2023-05-05 NOTE — Patient Instructions (Signed)

## 2023-05-05 NOTE — Progress Notes (Signed)
Terrance Carlson - 42 y.o. male MRN [id number]  Date of birth: 04-27-1981  Subjective Chief Complaint  Patient presents with   Annual Exam    HPI Terrance Carlson is a 42 y.o. male here today for annual exam.   He reports that he is doing well.  Doing well with current long term medications.   He is moderately active.  He feels that diet is pretty good. Weight is down about 6lbs since last visit.   He is a non-smoker.  Denies EtOH use.   Review of Systems  Constitutional:  Negative for chills, fever, malaise/fatigue and weight loss.  HENT:  Negative for congestion, ear pain and sore throat.   Eyes:  Negative for blurred vision, double vision and pain.  Respiratory:  Negative for cough and shortness of breath.   Cardiovascular:  Negative for chest pain and palpitations.  Gastrointestinal:  Negative for abdominal pain, blood in stool, constipation, heartburn and nausea.  Genitourinary:  Negative for dysuria and urgency.  Musculoskeletal:  Negative for joint pain and myalgias.  Neurological:  Negative for dizziness and headaches.  Endo/Heme/Allergies:  Does not bruise/bleed easily.  Psychiatric/Behavioral:  Negative for depression. The patient is not nervous/anxious and does not have insomnia.     Allergies  Allergen Reactions   Influenza Vaccines Other (See Comments)    Gianne Barre Syndrome   Other Other (See Comments)    Patient is allergic to the Flu Shot.    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    Hyperglycemia 04/29/2016   OSA (obstructive sleep apnea) 05/31/2017    Past Surgical History:  Procedure Laterality Date   OLECRANON BURSECTOMY Right 2017    Social History   Socioeconomic History   Marital status: Married    Spouse name: Not on file   Number of children: Not on file   Years of  education: Not on file   Highest education level: Not on file  Occupational History   Not on file  Tobacco Use   Smoking status: Former    Types: Cigarettes   Smokeless tobacco: Never  Substance and Sexual Activity   Alcohol use: Not Currently    Alcohol/week: 0.0 standard drinks of alcohol   Drug use: Never   Sexual activity: Yes    Partners: Female  Other Topics Concern   Not on file  Social History Narrative   Not on file   Social Determinants of Health   Financial Resource Strain: Not on file  Food Insecurity: Not on file  Transportation Needs: Not on file  Physical Activity: Not on file  Stress: Not on file  Social Connections: Not on file    No family history on file.  Health Maintenance  Topic Date Due   COVID-19 Vaccine (1) 09/09/2023 (Originally 09/25/1981)   Hepatitis C Screening  05/04/2024 (Originally 03/26/1998)   DTaP/Tdap/Td (2 - Td or Tdap) 01/27/2030   HIV Screening  Completed   HPV VACCINES  Aged Out     ----------------------------------------------------------------------------------------------------------------------------------------------------------------------------------------------------------------- Physical Exam BP 126/74 (BP Location: Right Arm, Patient Position: Sitting, Cuff Size: Large)   Pulse 78   Ht 5\' 10"  (1.778 m)   Wt 266 lb (120.7 kg)   SpO2 95%   BMI 38.17 kg/m   Physical Exam Constitutional:      General: He is not in acute distress.  HENT:     Head: Normocephalic and atraumatic.     Right Ear: Tympanic membrane and external ear normal.     Left Ear: Tympanic membrane and external ear normal.  Eyes:     General: No scleral icterus. Neck:     Thyroid: No thyromegaly.  Cardiovascular:     Rate and Rhythm: Normal rate and regular rhythm.     Heart sounds: Normal heart sounds.  Pulmonary:     Effort: Pulmonary effort is normal.     Breath sounds: Normal breath sounds.  Abdominal:     General: Bowel sounds are  normal. There is no distension.     Palpations: Abdomen is soft.     Tenderness: There is no abdominal tenderness. There is no guarding.  Musculoskeletal:     Cervical back: Normal range of motion.  Lymphadenopathy:     Cervical: No cervical adenopathy.  Skin:    General: Skin is warm and dry.     Findings: No rash.  Neurological:     Mental Status: He is alert and oriented to person, place, and time.     Cranial Nerves: No cranial nerve deficit.     Motor: No abnormal muscle tone.  Psychiatric:        Mood and Affect: Mood normal.        Behavior: Behavior normal.     ------------------------------------------------------------------------------------------------------------------------------------------------------------------------------------------------------------------- Assessment and Plan  Sinobronchitis Adding course of doxycycline.    Well adult exam Well adult Orders Placed This Encounter  Procedures   COMPLETE METABOLIC PANEL WITH GFR   CBC with Differential   Lipid Panel w/reflex Direct LDL   TSH   HgB A1c   Vitamin D (25 hydroxy)   Screenings: per lab orders Immunizations: UTD Anticipatory guidance/Risk factor reduction:  Recommendations per AVS.    Meds ordered this encounter  Medications   icosapent Ethyl (VASCEPA) 1 g capsule    Sig: Take 2 capsules (2 g total) by mouth 2 (two) times daily.    Dispense:  360 capsule    Refill:  1   doxycycline (VIBRA-TABS) 100 MG tablet    Sig: Take 1 tablet (100 mg total) by mouth 2 (two) times daily.    Dispense:  20 tablet    Refill:  0    No follow-ups on file.    This visit occurred during the SARS-CoV-2 public health emergency.  Safety protocols were in place, including screening questions prior to the visit, additional usage of staff PPE, and extensive cleaning of exam room while observing appropriate contact time as indicated for disinfecting solutions.

## 2023-05-05 NOTE — Assessment & Plan Note (Signed)
Adding course of doxycycline.

## 2023-05-12 LAB — LIPID PANEL W/REFLEX DIRECT LDL
Cholesterol: 299 mg/dL — ABNORMAL HIGH (ref <200)
HDL: 42 mg/dL (ref >=40)
Non-HDL Cholesterol (Calc): 257 mg/dL (calc) — ABNORMAL HIGH (ref <130)
Total CHOL/HDL Ratio: 7.1 (calc) — ABNORMAL HIGH (ref <5.0)
Triglycerides: 1262 mg/dL — ABNORMAL HIGH (ref <150)

## 2023-05-12 LAB — DIRECT LDL: Direct LDL: 88 mg/dL (ref <100)

## 2023-05-12 LAB — CBC WITH DIFFERENTIAL/PLATELET
Absolute Monocytes: 578 cells/uL (ref 200–950)
Basophils Absolute: 49 cells/uL (ref 0–200)
Basophils Relative: 0.5 %
Eosinophils Absolute: 78 cells/uL (ref 15–500)
Eosinophils Relative: 0.8 %
HCT: 44.6 % (ref 38.5–50.0)
Hemoglobin: 14.7 g/dL (ref 13.2–17.1)
Lymphs Abs: 2489 cells/uL (ref 850–3900)
MCH: 29.7 pg (ref 27.0–33.0)
MCHC: 33 g/dL (ref 32.0–36.0)
MCV: 90.1 fL (ref 80.0–100.0)
MPV: 9.9 fL (ref 7.5–12.5)
Monocytes Relative: 5.9 %
Neutro Abs: 6605 cells/uL (ref 1500–7800)
Neutrophils Relative %: 67.4 %
Platelets: 267 10*3/uL (ref 140–400)
RBC: 4.95 10*6/uL (ref 4.20–5.80)
RDW: 14 % (ref 11.0–15.0)
Total Lymphocyte: 25.4 %
WBC: 9.8 10*3/uL (ref 3.8–10.8)

## 2023-05-12 LAB — COMPLETE METABOLIC PANEL WITH GFR
AG Ratio: 2.1 (calc) (ref 1.0–2.5)
ALT: 28 U/L (ref 9–46)
AST: 17 U/L (ref 10–40)
Albumin: 4.9 g/dL (ref 3.6–5.1)
Alkaline phosphatase (APISO): 80 U/L (ref 36–130)
BUN: 18 mg/dL (ref 7–25)
CO2: 26 mmol/L (ref 20–32)
Calcium: 10.1 mg/dL (ref 8.6–10.3)
Chloride: 103 mmol/L (ref 98–110)
Creat: 0.81 mg/dL (ref 0.60–1.29)
Globulin: 2.3 g/dL (calc) (ref 1.9–3.7)
Glucose, Bld: 93 mg/dL (ref 65–99)
Potassium: 4.7 mmol/L (ref 3.5–5.3)
Sodium: 137 mmol/L (ref 135–146)
Total Bilirubin: 0.7 mg/dL (ref 0.2–1.2)
Total Protein: 7.2 g/dL (ref 6.1–8.1)
eGFR: 112 mL/min/{1.73_m2} (ref >=60)

## 2023-05-12 LAB — TSH: TSH: 1.21 mIU/L (ref 0.40–4.50)

## 2023-05-12 LAB — HEMOGLOBIN A1C
Hgb A1c MFr Bld: 5.6 % of total Hgb (ref <5.7)
Mean Plasma Glucose: 114 mg/dL
eAG (mmol/L): 6.3 mmol/L

## 2023-05-12 LAB — VITAMIN D 25 HYDROXY (VIT D DEFICIENCY, FRACTURES): Vit D, 25-Hydroxy: 25 ng/mL — ABNORMAL LOW (ref 30–100)

## 2023-05-13 ENCOUNTER — Encounter: Payer: Self-pay | Admitting: Family Medicine

## 2023-05-17 ENCOUNTER — Encounter: Payer: Self-pay | Admitting: Family Medicine

## 2023-06-16 ENCOUNTER — Encounter (INDEPENDENT_AMBULATORY_CARE_PROVIDER_SITE_OTHER): Payer: Managed Care, Other (non HMO) | Admitting: Family Medicine

## 2023-06-16 DIAGNOSIS — H6090 Unspecified otitis externa, unspecified ear: Secondary | ICD-10-CM | POA: Diagnosis not present

## 2023-06-16 MED ORDER — CIPROFLOXACIN-DEXAMETHASONE 0.3-0.1 % OT SUSP: 4.0000 [drp] | Freq: Two times a day (BID) | OTIC | 0 refills | Status: AC

## 2023-06-16 NOTE — Telephone Encounter (Signed)
Please see the MyChart message reply(ies) for my assessment and plan.    This patient gave consent for this Medical Advice Message and is aware that it may result in a bill to their insurance company, as well as the possibility of receiving a bill for a co-payment or deductible. They are an established patient, but are not seeking medical advice exclusively about a problem treated during an in person or video visit in the last seven days. I did not recommend an in person or video visit within seven days of my reply.    I spent a total of 7 minutes cumulative time within 7 days through MyChart messaging.  Neoma Uhrich S Kenzel Ruesch, DO   

## 2023-07-28 ENCOUNTER — Encounter: Payer: Self-pay | Admitting: Family Medicine

## 2023-08-01 ENCOUNTER — Ambulatory Visit (INDEPENDENT_AMBULATORY_CARE_PROVIDER_SITE_OTHER): Payer: Managed Care, Other (non HMO) | Admitting: Family Medicine

## 2023-08-01 DIAGNOSIS — Z111 Encounter for screening for respiratory tuberculosis: Secondary | ICD-10-CM

## 2023-08-01 NOTE — Progress Notes (Signed)
   Established Patient Office Visit  Subjective   Patient ID: Terrance Carlson, male    DOB: 09/18/1981  Age: 42 y.o. MRN: [id number]  Chief Complaint  Patient presents with   PPD Placement    PPD placement nurse visit.     HPI  PPD placement -nurse visit. Patient denies problems with past PPD placements.   ROS    Objective:     There were no vitals taken for this visit.   Physical Exam   No results found for any visits on 08/01/23.    The 10-year ASCVD risk score (Arnett DK, et al., 2019) is: 11.3%    Assessment & Plan:  Admin  PPD left forearm. Patient tolerated injection well without complications.  Problem List Items Addressed This Visit   None   No follow-ups on file.    Elizabeth Palau, LPN

## 2023-08-01 NOTE — Patient Instructions (Signed)
Return in 48 to 72 hours for PPD reading as nurse visit.

## 2023-08-01 NOTE — Progress Notes (Signed)
Medical screening examination/treatment was performed by qualified clinical staff member and as supervising physician I was immediately available for consultation/collaboration. I have reviewed documentation and agree with assessment and plan.  Cody Matthews, DO  

## 2023-08-03 ENCOUNTER — Ambulatory Visit (INDEPENDENT_AMBULATORY_CARE_PROVIDER_SITE_OTHER): Payer: Managed Care, Other (non HMO) | Admitting: Family Medicine

## 2023-08-03 VITALS — BP 139/80 | HR 65

## 2023-08-03 DIAGNOSIS — Z111 Encounter for screening for respiratory tuberculosis: Secondary | ICD-10-CM | POA: Diagnosis not present

## 2023-08-03 NOTE — Progress Notes (Signed)
Medical screening examination/treatment was performed by qualified clinical staff member and as supervising physician I was immediately available for consultation/collaboration. I have reviewed documentation and agree with assessment and plan.  Cody Matthews, DO  

## 2023-08-03 NOTE — Progress Notes (Signed)
   Established Patient Office Visit  Subjective   Patient ID: Terrance Carlson, male    DOB: August 22, 1981  Age: 42 y.o. MRN: [id number]  Chief Complaint  Patient presents with   PPD Reading    HPI  Terrance Carlson is here for PPD reading.   ROS    Objective:     BP 139/80   Pulse 65   SpO2 98%    Physical Exam   No results found for any visits on 08/03/23.    The 10-year ASCVD risk score (Arnett DK, et al., 2019) is: 13.6%    Assessment & Plan:  PPD reading - negative 0 mm  Problem List Items Addressed This Visit   None Visit Diagnoses     Screening examination for pulmonary tuberculosis    -  Primary       No follow-ups on file.    Esmond Harps, CMA

## 2023-10-05 ENCOUNTER — Encounter (INDEPENDENT_AMBULATORY_CARE_PROVIDER_SITE_OTHER): Payer: Self-pay | Admitting: Family Medicine

## 2023-10-05 DIAGNOSIS — J0100 Acute maxillary sinusitis, unspecified: Secondary | ICD-10-CM

## 2023-10-05 DIAGNOSIS — Z87891 Personal history of nicotine dependence: Secondary | ICD-10-CM

## 2023-10-05 MED ORDER — DOXYCYCLINE HYCLATE 100 MG PO TABS
100.0000 mg | ORAL_TABLET | Freq: Two times a day (BID) | ORAL | 0 refills | Status: DC
Start: 2023-10-05 — End: 2023-12-28

## 2023-10-05 NOTE — Telephone Encounter (Signed)
Please see the MyChart message reply(ies) for my assessment and plan.    This patient gave consent for this Medical Advice Message and is aware that it may result in a bill to [employer], as well as the possibility of receiving a bill for a co-payment or deductible. They are an established patient, but are not seeking medical advice exclusively about a problem treated during an in person or video visit in the last seven days. I did not recommend an in person or video visit within seven days of my reply.    I spent a total of 7 minutes cumulative time within 7 days through [employer].  Everrett Coombe, DO

## 2023-10-06 ENCOUNTER — Other Ambulatory Visit: Payer: Self-pay | Admitting: Family Medicine

## 2023-11-07 ENCOUNTER — Encounter: Payer: Self-pay | Admitting: Family Medicine

## 2023-11-07 NOTE — Telephone Encounter (Signed)
Yes, will accept as new patient.   Thanks!

## 2023-12-27 ENCOUNTER — Encounter (INDEPENDENT_AMBULATORY_CARE_PROVIDER_SITE_OTHER): Payer: Managed Care, Other (non HMO) | Admitting: Family Medicine

## 2023-12-27 DIAGNOSIS — J40 Bronchitis, not specified as acute or chronic: Secondary | ICD-10-CM | POA: Diagnosis not present

## 2023-12-27 DIAGNOSIS — J329 Chronic sinusitis, unspecified: Secondary | ICD-10-CM

## 2023-12-28 MED ORDER — DOXYCYCLINE HYCLATE 100 MG PO TABS: 100.0000 mg | ORAL_TABLET | Freq: Two times a day (BID) | ORAL | 0 refills | Status: DC

## 2023-12-28 NOTE — Telephone Encounter (Signed)
 Please see the MyChart message reply(ies) for my assessment and plan.    This patient gave consent for this Medical Advice Message and is aware that it may result in a bill to [employer], as well as the possibility of receiving a bill for a co-payment or deductible. They are an established patient, but are not seeking medical advice exclusively about a problem treated during an in person or video visit in the last seven days. I did not recommend an in person or video visit within seven days of my reply.    I spent a total of 6 minutes cumulative time within 7 days through [employer].  Everrett Coombe, DO

## 2024-02-10 ENCOUNTER — Encounter: Payer: Self-pay | Admitting: Family Medicine

## 2024-02-10 ENCOUNTER — Telehealth (INDEPENDENT_AMBULATORY_CARE_PROVIDER_SITE_OTHER): Payer: Managed Care, Other (non HMO) | Admitting: Medical-Surgical

## 2024-02-10 ENCOUNTER — Encounter: Payer: Self-pay | Admitting: Medical-Surgical

## 2024-02-10 ENCOUNTER — Telehealth: Payer: Self-pay

## 2024-02-10 DIAGNOSIS — J029 Acute pharyngitis, unspecified: Secondary | ICD-10-CM

## 2024-02-10 DIAGNOSIS — Z20828 Contact with and (suspected) exposure to other viral communicable diseases: Secondary | ICD-10-CM

## 2024-02-10 DIAGNOSIS — R5383 Other fatigue: Secondary | ICD-10-CM

## 2024-02-10 MED ORDER — OSELTAMIVIR PHOSPHATE 75 MG PO CAPS: 75.0000 mg | ORAL_CAPSULE | Freq: Two times a day (BID) | ORAL | 0 refills | Status: DC

## 2024-02-10 MED ORDER — HYDROCODONE BIT-HOMATROP MBR 5-1.5 MG/5ML PO SOLN: 5.0000 mL | Freq: Three times a day (TID) | ORAL | 0 refills | Status: DC | PRN

## 2024-02-10 MED ORDER — HYDROCOD POLI-CHLORPHE POLI ER 10-8 MG/5ML PO SUER: 5.0000 mL | Freq: Two times a day (BID) | ORAL | 0 refills | Status: DC | PRN

## 2024-02-10 NOTE — Telephone Encounter (Signed)
Copied from CRM [id number]. Topic: Clinical - Prescription Issue >> Feb 10, 2024 11:48 AM Shelah Lewandowsky wrote:  Reason for CRM: Desiree with [facility]- HYDROcodone bit-homatropine (HYCODAN) 5-1.5 MG/5ML syrup- out of stock- have not had it for a month- no release date- only have Tussinex- will have to try another pharmacy or order Tussinex  [phone]

## 2024-02-10 NOTE — Progress Notes (Signed)
 Virtual Visit via Video Note  I connected with Terrance Carlson on 02/11/24 at 10:50 AM EST by a video enabled telemedicine application and verified that I am speaking with the correct person using two identifiers.   I discussed the limitations of evaluation and management by telemedicine and the availability of in person appointments. The patient expressed understanding and agreed to proceed.  Patient location: home Provider locations: office  Subjective:    CC: flu-like symptoms  HPI: Pleasant 43--year-old male presenting via MyChart video visit with reports of symptoms starting yesterday including sore throat, fatigue, headache, body aches, chills, low-grade temp of 99.2, poor appetite, sinus congestion, and cough productive of thin cloudy mucus.  Reports that his wife was sick with similar symptoms last week and was treated with Tamiflu and over-the-counter medications.  Denies chest pain, shortness of breath, GI symptoms, and other sick contacts.  Has been using Mucinex DM daytime and nighttime which seems to help with symptoms.  Past medical history, Surgical history, Family history not pertinant except as noted below, Social history, Allergies, and medications have been entered into the medical record, reviewed, and corrections made.   Review of Systems: See HPI for pertinent positives and negatives.   Objective:    General: Speaking clearly in complete sentences without any shortness of breath.  Alert and oriented x3.  Normal judgment. No apparent acute distress.  Impression and Recommendations:    1. Exposure to influenza (Primary) Known exposure to influenza via his wife's illness and is now symptomatic.  We will go ahead and treat with Tamiflu.  Adding Tussionex for nighttime cough management.  Okay to continue over-the-counter cold and flu preparations for symptom management.  Stay well-hydrated and eat small frequent snacks/meals.  Increase rest.  If symptoms worsen or fail to  improve, return for further evaluation.  I discussed the assessment and treatment plan with the patient. The patient was provided an opportunity to ask questions and all were answered. The patient agreed with the plan and demonstrated an understanding of the instructions.   The patient was advised to call back or seek an in-person evaluation if the symptoms worsen or if the condition fails to improve as anticipated.  Return if symptoms worsen or fail to improve.  Thayer Ohm, DNP, APRN, FNP-BC [facility]

## 2024-02-11 ENCOUNTER — Encounter: Payer: Self-pay | Admitting: Medical-Surgical

## 2024-04-04 ENCOUNTER — Telehealth: Payer: Self-pay

## 2024-04-04 ENCOUNTER — Other Ambulatory Visit (HOSPITAL_COMMUNITY): Payer: Self-pay

## 2024-04-04 ENCOUNTER — Encounter: Payer: Self-pay | Admitting: Family Medicine

## 2024-04-04 MED ORDER — ICOSAPENT ETHYL 1 G PO CAPS: 2.0000 g | ORAL_CAPSULE | Freq: Two times a day (BID) | ORAL | 1 refills | Status: DC

## 2024-04-04 NOTE — Telephone Encounter (Signed)
 Pharmacy Patient Advocate Encounter   Received notification from Patient Pharmacy that prior authorization for Vascepa 1 gm is required/requested.   Insurance verification completed.   The patient is insured through [employer] .   Per test claim: PA required; PA submitted to above mentioned insurance via CoverMyMeds Key/confirmation #/EOC [facility] Status is pending

## 2024-04-05 NOTE — Telephone Encounter (Signed)
 Pharmacy Patient Advocate Encounter  Received notification from [employer] that Prior Authorization for Vascepa has been DENIED.  Full denial letter will be uploaded to the media tab. See denial reason below.   PA #/Case ID/Reference #: Terrance Carlson

## 2024-04-09 NOTE — Telephone Encounter (Signed)
 Attempted call to patient. Left a voice mail message requesting a return call.

## 2024-04-09 NOTE — Telephone Encounter (Signed)
 Spoke with patient. He is not aware of having been on Crestor  in past . He remembers taking fish oil and then going to Vescepa but does not remember taking Crestor  at all.

## 2024-04-11 MED ORDER — ICOSAPENT ETHYL 1 G PO CAPS: 2.0000 g | ORAL_CAPSULE | Freq: Two times a day (BID) | ORAL | 1 refills | Status: DC

## 2024-04-11 MED ORDER — ROSUVASTATIN CALCIUM 20 MG PO TABS: 20.0000 mg | ORAL_TABLET | Freq: Every day | ORAL | 3 refills | Status: AC

## 2024-04-11 NOTE — Telephone Encounter (Signed)
 Patient is agreeable to restarting Crestor .

## 2024-04-11 NOTE — Addendum Note (Signed)
 Addended by: Ahsley Attwood E on: 04/11/2024 12:42 PM   Modules accepted: Orders

## 2024-04-11 NOTE — Telephone Encounter (Signed)
 Would you please resubmit the PA for the Vascepa  - patient restarted on crestor  .

## 2024-04-12 ENCOUNTER — Other Ambulatory Visit (HOSPITAL_COMMUNITY): Payer: Self-pay

## 2024-04-12 ENCOUNTER — Telehealth: Payer: Self-pay

## 2024-04-12 NOTE — Telephone Encounter (Signed)
 Pharmacy Patient Advocate Encounter   Received notification from Pt Calls Messages that prior authorization for Vascepa  1 is required/requested.   Insurance verification completed.   The patient is insured through [employer] .   Per test claim: PA required; PA submitted to above mentioned insurance via CoverMyMeds Key/confirmation #/EOC [id number] Status is pending

## 2024-04-12 NOTE — Telephone Encounter (Signed)
 Pharmacy Patient Advocate Encounter  Received notification from [employer] that Prior Authorization for Vascepa  1 has been DENIED.  Full denial letter will be uploaded to the media tab. See denial reason below.   PA #/Case ID/Reference #: [id number]

## 2024-04-18 NOTE — Telephone Encounter (Signed)
 Pharmacy Patient Advocate Encounter  Received notification from [employer] that Prior Authorization for Vascepa  1 has been DENIED.  Full denial letter will be uploaded to the media tab. See denial reason below.   PA #/Case ID/Reference #: [id number]

## 2024-05-07 ENCOUNTER — Ambulatory Visit

## 2024-05-07 ENCOUNTER — Ambulatory Visit (INDEPENDENT_AMBULATORY_CARE_PROVIDER_SITE_OTHER): Admitting: Family Medicine

## 2024-05-07 VITALS — BP 123/76 | HR 83 | Ht 70.0 in | Wt 257.0 lb

## 2024-05-07 DIAGNOSIS — Z0000 Encounter for general adult medical examination without abnormal findings: Secondary | ICD-10-CM | POA: Diagnosis not present

## 2024-05-07 DIAGNOSIS — T148XXA Other injury of unspecified body region, initial encounter: Secondary | ICD-10-CM

## 2024-05-07 DIAGNOSIS — E559 Vitamin D deficiency, unspecified: Secondary | ICD-10-CM

## 2024-05-07 DIAGNOSIS — R739 Hyperglycemia, unspecified: Secondary | ICD-10-CM | POA: Diagnosis not present

## 2024-05-07 DIAGNOSIS — E781 Pure hyperglyceridemia: Secondary | ICD-10-CM

## 2024-05-07 DIAGNOSIS — M25531 Pain in right wrist: Secondary | ICD-10-CM

## 2024-05-07 DIAGNOSIS — M25561 Pain in right knee: Secondary | ICD-10-CM | POA: Diagnosis not present

## 2024-05-07 NOTE — Patient Instructions (Signed)

## 2024-05-07 NOTE — Progress Notes (Signed)
 Terrance Carlson - 43 y.o. male MRN [id number]  Date of birth: 02/10/1981  Subjective Chief Complaint  Patient presents with   Annual Exam    HPI Terrance Carlson is a 43 y.o. male here today for annual exam.   He reports that he is doing well. He has stiffness and swelling in the R wrist. Also has bony fragment along lateral knee  He is moderately active and feels that diet is pretty good most of the time.   He is a non-smoker.  Denies EtOH use.   Review of Systems  Constitutional:  Negative for chills, fever, malaise/fatigue and weight loss.  HENT:  Negative for congestion, ear pain and sore throat.   Eyes:  Negative for blurred vision, double vision and pain.  Respiratory:  Negative for cough and shortness of breath.   Cardiovascular:  Negative for chest pain and palpitations.  Gastrointestinal:  Negative for abdominal pain, blood in stool, constipation, heartburn and nausea.  Genitourinary:  Negative for dysuria and urgency.  Musculoskeletal:  Negative for joint pain and myalgias.  Neurological:  Negative for dizziness and headaches.  Endo/Heme/Allergies:  Does not bruise/bleed easily.  Psychiatric/Behavioral:  Negative for depression. The patient is not nervous/anxious and does not have insomnia.     Allergies  Allergen Reactions   Influenza Vaccines Other (See Comments)    Gianne Barre Syndrome   Other Other (See Comments)    Patient is allergic to the Flu Shot.    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    Hyperglycemia 04/29/2016   OSA (obstructive sleep apnea) 05/31/2017    Past Surgical History:  Procedure Laterality Date   OLECRANON BURSECTOMY Right 2017    Social History   Socioeconomic History   Marital status: Married    Spouse name: Not on file   Number of children: Not on file    Years of education: Not on file   Highest education level: Bachelor's degree (e.g., BA, AB, BS)  Occupational History   Not on file  Tobacco Use   Smoking status: Former    Types: Cigarettes   Smokeless tobacco: Never  Substance and Sexual Activity   Alcohol use: Not Currently    Alcohol/week: 0.0 standard drinks of alcohol   Drug use: Never   Sexual activity: Yes    Partners: Female  Other Topics Concern   Not on file  Social History Narrative   Not on file   Social Drivers of Health   Financial Resource Strain: Medium Risk (05/07/2024)   Overall Financial Resource Strain (CARDIA)    Difficulty of Paying Living Expenses: Somewhat hard  Food Insecurity: No Food Insecurity (05/07/2024)   Hunger Vital Sign    Worried About Running Out of Food in the Last Year: Never true    Ran Out of Food in the Last Year: Never true  Transportation Needs: No Transportation Needs (05/07/2024)   PRAPARE - Administrator, Civil Service (Medical): No    Lack of Transportation (Non-Medical): No  Physical Activity: Sufficiently Active (05/07/2024)   Exercise Vital Sign    Days of Exercise per Week: 4 days    Minutes of Exercise per Session: 90 min  Stress: No Stress Concern Present (05/07/2024)   [employer] of Occupational Health - Occupational Stress Questionnaire    Feeling of Stress :  Only a little  Social Connections: Moderately Integrated (05/07/2024)   Social Connection and Isolation Panel [NHANES]    Frequency of Communication with Friends and Family: More than three times a week    Frequency of Social Gatherings with Friends and Family: Three times a week    Attends Religious Services: More than 4 times per year    Active Member of Clubs or Organizations: No    Attends Banker Meetings: Not on file    Marital Status: Married    History reviewed. No pertinent family history.  Health Maintenance  Topic Date Due   Hepatitis C Screening  Never done    COVID-19 Vaccine (1 - 2024-25 season) Never done   DTaP/Tdap/Td (2 - Td or Tdap) 01/27/2030   HIV Screening  Completed   Pneumococcal Vaccine 58-44 Years old  Aged Out   HPV VACCINES  Aged Out   Meningococcal B Vaccine  Aged Out     ----------------------------------------------------------------------------------------------------------------------------------------------------------------------------------------------------------------- Physical Exam BP 123/76 (BP Location: Left Arm, Patient Position: Sitting, Cuff Size: Large)   Pulse 83   Ht 5\' 10"  (1.778 m)   Wt 257 lb (116.6 kg)   SpO2 96%   BMI 36.88 kg/m   Physical Exam Constitutional:      General: He is not in acute distress. HENT:     Head: Normocephalic and atraumatic.     Right Ear: Tympanic membrane and external ear normal.     Left Ear: Tympanic membrane and external ear normal.  Eyes:     General: No scleral icterus. Neck:     Thyroid: No thyromegaly.  Cardiovascular:     Rate and Rhythm: Normal rate and regular rhythm.     Heart sounds: Normal heart sounds.  Pulmonary:     Effort: Pulmonary effort is normal.     Breath sounds: Normal breath sounds.  Abdominal:     General: Bowel sounds are normal. There is no distension.     Palpations: Abdomen is soft.     Tenderness: There is no abdominal tenderness. There is no guarding.  Musculoskeletal:     Cervical back: Normal range of motion.  Lymphadenopathy:     Cervical: No cervical adenopathy.  Skin:    General: Skin is warm and dry.     Findings: No rash.  Neurological:     Mental Status: He is alert and oriented to person, place, and time.     Cranial Nerves: No cranial nerve deficit.     Motor: No abnormal muscle tone.  Psychiatric:        Mood and Affect: Mood normal.        Behavior: Behavior normal.      ------------------------------------------------------------------------------------------------------------------------------------------------------------------------------------------------------------------- Assessment and Plan  Well adult exam Well adult Orders Placed This Encounter  Procedures   DG Wrist Complete Right    Standing Status:   Future    Expiration Date:   05/07/2025    Reason for Exam (SYMPTOM  OR DIAGNOSIS REQUIRED):   right wrist pain    Preferred imaging location?:   [facility]   DG Knee Complete 4 Views Right    Standing Status:   Future    Expiration Date:   05/07/2025    Reason for Exam (SYMPTOM  OR DIAGNOSIS REQUIRED):   ? loose body in knee    Preferred imaging location?:   [facility]   CMP14+EGFR   CBC with Differential/Platelet   Lipid Panel With LDL/HDL Ratio   Vitamin D  (25 hydroxy)  HgB A1c   Screenings: per lab orders Immunizations: UTD Anticipatory guidance/Risk factor reduction:  Recommendations per AVS.    No orders of the defined types were placed in this encounter.   No follow-ups on file.

## 2024-05-07 NOTE — Assessment & Plan Note (Signed)
 Well adult Orders Placed This Encounter  Procedures   DG Wrist Complete Right    Standing Status:   Future    Expiration Date:   05/07/2025    Reason for Exam (SYMPTOM  OR DIAGNOSIS REQUIRED):   right wrist pain    Preferred imaging location?:   [facility]   DG Knee Complete 4 Views Right    Standing Status:   Future    Expiration Date:   05/07/2025    Reason for Exam (SYMPTOM  OR DIAGNOSIS REQUIRED):   ? loose body in knee    [facility]    CMP14+EGFR   CBC with Differential/Platelet   Lipid Panel With LDL/HDL Ratio   Vitamin D  (25 hydroxy)   HgB A1c   Screenings: per lab orders Immunizations: UTD Anticipatory guidance/Risk factor reduction:  Recommendations per AVS.

## 2024-05-12 LAB — CBC WITH DIFFERENTIAL/PLATELET
Basophils Absolute: 0 10*3/uL (ref 0.0–0.2)
Basos: 0 %
EOS (ABSOLUTE): 0.1 10*3/uL (ref 0.0–0.4)
Eos: 1 %
Hematocrit: 44 % (ref 37.5–51.0)
Hemoglobin: 14.4 g/dL (ref 13.0–17.7)
Immature Grans (Abs): 0 10*3/uL (ref 0.0–0.1)
Immature Granulocytes: 0 %
Lymphocytes Absolute: 3 10*3/uL (ref 0.7–3.1)
Lymphs: 31 %
MCH: 30.1 pg (ref 26.6–33.0)
MCHC: 32.7 g/dL (ref 31.5–35.7)
MCV: 92 fL (ref 79–97)
Monocytes Absolute: 0.8 10*3/uL (ref 0.1–0.9)
Monocytes: 8 %
Neutrophils Absolute: 5.7 10*3/uL (ref 1.4–7.0)
Neutrophils: 60 %
Platelets: 264 10*3/uL (ref 150–450)
RBC: 4.78 x10E6/uL (ref 4.14–5.80)
RDW: 13.6 % (ref 11.6–15.4)
WBC: 9.6 10*3/uL (ref 3.4–10.8)

## 2024-05-12 LAB — CMP14+EGFR
ALT: 59 IU/L — ABNORMAL HIGH (ref 0–44)
AST: 40 IU/L (ref 0–40)
Albumin: 4.7 g/dL (ref 4.1–5.1)
Alkaline Phosphatase: 87 IU/L (ref 44–121)
BUN/Creatinine Ratio: 17 (ref 9–20)
BUN: 17 mg/dL (ref 6–24)
Bilirubin Total: 0.4 mg/dL (ref 0.0–1.2)
CO2: 18 mmol/L — ABNORMAL LOW (ref 20–29)
Calcium: 9.5 mg/dL (ref 8.7–10.2)
Chloride: 104 mmol/L (ref 96–106)
Creatinine, Ser: 1 mg/dL (ref 0.76–1.27)
Globulin, Total: 2 g/dL (ref 1.5–4.5)
Glucose: 90 mg/dL (ref 70–99)
Potassium: 4.4 mmol/L (ref 3.5–5.2)
Sodium: 142 mmol/L (ref 134–144)
Total Protein: 6.7 g/dL (ref 6.0–8.5)
eGFR: 95 mL/min/{1.73_m2} (ref >59)

## 2024-05-12 LAB — LIPID PANEL WITH LDL/HDL RATIO
Cholesterol, Total: 129 mg/dL (ref 100–199)
HDL: 54 mg/dL (ref >39)
LDL Chol Calc (NIH): 50 mg/dL (ref 0–99)
LDL/HDL Ratio: 0.9 ratio (ref 0.0–3.6)
Triglycerides: 144 mg/dL (ref 0–149)
VLDL Cholesterol Cal: 25 mg/dL (ref 5–40)

## 2024-05-12 LAB — HEMOGLOBIN A1C
Est. average glucose Bld gHb Est-mCnc: 103 mg/dL
Hgb A1c MFr Bld: 5.2 % (ref 4.8–5.6)

## 2024-05-12 LAB — VITAMIN D 25 HYDROXY (VIT D DEFICIENCY, FRACTURES): Vit D, 25-Hydroxy: 55.6 ng/mL (ref 30.0–100.0)

## 2024-05-18 ENCOUNTER — Ambulatory Visit: Payer: Self-pay | Admitting: Family Medicine

## 2024-05-18 DIAGNOSIS — M1711 Unilateral primary osteoarthritis, right knee: Secondary | ICD-10-CM

## 2024-05-18 DIAGNOSIS — M2341 Loose body in knee, right knee: Secondary | ICD-10-CM
